# Patient Record
Sex: Female | Born: 1960 | Race: Black or African American | Hispanic: No | Marital: Married | Smoking: Never smoker
Health system: Southern US, Community
[De-identification: ages and names within clinical notes are randomized; demographics above are authoritative.]

## PROBLEM LIST (undated history)

## (undated) DIAGNOSIS — E079 Disorder of thyroid, unspecified: Secondary | ICD-10-CM

## (undated) DIAGNOSIS — E039 Hypothyroidism, unspecified: Secondary | ICD-10-CM

## (undated) DIAGNOSIS — E7800 Pure hypercholesterolemia, unspecified: Secondary | ICD-10-CM

## (undated) DIAGNOSIS — E119 Type 2 diabetes mellitus without complications: Secondary | ICD-10-CM

## (undated) HISTORY — DX: Disorder of thyroid, unspecified: E07.9

## (undated) HISTORY — DX: Pure hypercholesterolemia, unspecified: E78.00

## (undated) HISTORY — PX: FOOT SURGERY: SHX648

## (undated) HISTORY — DX: Type 2 diabetes mellitus without complications: E11.9

---

## 2004-01-11 ENCOUNTER — Ambulatory Visit: Payer: Self-pay | Admitting: Ophthalmology

## 2009-04-03 ENCOUNTER — Ambulatory Visit: Payer: Self-pay | Admitting: Internal Medicine

## 2012-11-18 ENCOUNTER — Ambulatory Visit: Payer: Self-pay | Admitting: Podiatry

## 2013-04-26 ENCOUNTER — Encounter: Payer: Self-pay | Admitting: Podiatry

## 2013-04-26 ENCOUNTER — Ambulatory Visit (INDEPENDENT_AMBULATORY_CARE_PROVIDER_SITE_OTHER): Payer: BC Managed Care – PPO | Admitting: Podiatry

## 2013-04-26 ENCOUNTER — Ambulatory Visit (INDEPENDENT_AMBULATORY_CARE_PROVIDER_SITE_OTHER): Payer: BC Managed Care – PPO

## 2013-04-26 VITALS — Resp 16 | Ht 64.0 in | Wt 238.0 lb

## 2013-04-26 DIAGNOSIS — B351 Tinea unguium: Secondary | ICD-10-CM

## 2013-04-26 DIAGNOSIS — M79609 Pain in unspecified limb: Secondary | ICD-10-CM

## 2013-04-26 DIAGNOSIS — E1149 Type 2 diabetes mellitus with other diabetic neurological complication: Secondary | ICD-10-CM

## 2013-04-26 DIAGNOSIS — M79673 Pain in unspecified foot: Secondary | ICD-10-CM

## 2013-04-26 DIAGNOSIS — Q828 Other specified congenital malformations of skin: Secondary | ICD-10-CM

## 2013-04-26 NOTE — Progress Notes (Signed)
   Subjective:    Patient ID: Sara Buchanan, female    DOB: 06-04-1960, 53 y.o.   MRN: [id number]  HPI Comments: I HAVE A CALLUS ON BOTH FEET, MY TOENAILS ARE THICK WITH FUNGUS AND MY FEET PEEL. IVE BEEN HAVING THESE PROBLEMS OFF AND ON THIS YEAR. THEY GO AWAY AND COME BACK. I GET WIDER SHOES, SOAK MY FEET JOHNSON FOOT SOAK AND EPSON SALT AND TRIM THE DEAD SKIN.  Foot Pain      Review of Systems  Constitutional: Negative.   HENT: Negative.   Eyes: Positive for itching.  Respiratory: Negative.   Cardiovascular: Positive for leg swelling.  Gastrointestinal: Negative.   Genitourinary: Negative.   Musculoskeletal: Negative.   Skin:       CHANGE IN NAILS  Allergic/Immunologic: Negative.   Neurological: Negative.   Hematological: Negative.   Psychiatric/Behavioral: Negative.        Objective:   Physical Exam: I have reviewed her past history medications allergies surgeries social history and review of systems. Pulses are palpable bilateral. Neurologic sensorium is decreased per since once the monofilament to the level of the metatarsophalangeal joints. Deep tendon reflexes are brisk and equal bilateral. Muscle strength is 5 over 5 dorsiflexors plantar flexors inverters everters all intrinsic musculature is intact. Orthopedic evaluation demonstrates pes planus bilateral mild flexible hammertoe deformities. Mild HAV deformities. Cutaneous evaluation demonstrates supple well hydrated cutis nails are thick yellow dystrophic with mycotic painful palpation. Reactive hyperkeratotic tissue particularly porokeratosis to the plantar lateral aspect of the fifth metatarsal heads bilaterally left greater than right. Radiographic evaluation demonstrates osteoarthritic changes of the foot bilateral.        Assessment & Plan:  Assessment: Diabetic peripheral neuropathy. Pain in limb secondary to onychomycosis 1 through 5 bilateral. Porokeratosis bilateral.  Plan: Discussed etiology pathology  conservative versus surgical therapies. Discussed appropriate shoe gear stretching exercises and ice therapy. Debridement nails 1 through 5 bilateral is cover service debridement reactive hyperkeratosis bilateral is cover service secondary to diabetes and neuropathy. I will followup with her in 2 months for reevaluation

## 2013-08-02 ENCOUNTER — Encounter: Payer: Self-pay | Admitting: Podiatry

## 2013-08-02 ENCOUNTER — Ambulatory Visit (INDEPENDENT_AMBULATORY_CARE_PROVIDER_SITE_OTHER): Payer: BC Managed Care – PPO | Admitting: Podiatry

## 2013-08-02 DIAGNOSIS — M79609 Pain in unspecified limb: Secondary | ICD-10-CM

## 2013-08-02 DIAGNOSIS — Q828 Other specified congenital malformations of skin: Secondary | ICD-10-CM

## 2013-08-02 DIAGNOSIS — E1149 Type 2 diabetes mellitus with other diabetic neurological complication: Secondary | ICD-10-CM

## 2013-08-02 DIAGNOSIS — B351 Tinea unguium: Secondary | ICD-10-CM

## 2013-08-02 DIAGNOSIS — M79676 Pain in unspecified toe(s): Secondary | ICD-10-CM

## 2013-08-02 NOTE — Progress Notes (Signed)
She presents today for chief complaint of painful elongated toenails.  Objective: Pulses are palpable bilateral. Nails are thick yellow dystrophic onychomycotic and painful palpation.  Assessment: Pain in limb secondary to onychomycosis 1 through 5 bilateral.  Plan: Debridement of nails 1 through 5 bilateral covered service secondary to pain. 

## 2013-11-01 ENCOUNTER — Ambulatory Visit: Payer: BC Managed Care – PPO | Admitting: Podiatry

## 2013-11-09 ENCOUNTER — Ambulatory Visit (INDEPENDENT_AMBULATORY_CARE_PROVIDER_SITE_OTHER): Payer: BC Managed Care – PPO

## 2013-11-09 ENCOUNTER — Encounter: Payer: Self-pay | Admitting: Podiatry

## 2013-11-09 ENCOUNTER — Ambulatory Visit (INDEPENDENT_AMBULATORY_CARE_PROVIDER_SITE_OTHER): Payer: BC Managed Care – PPO | Admitting: Podiatry

## 2013-11-09 VITALS — BP 121/71 | HR 72 | Resp 16

## 2013-11-09 DIAGNOSIS — M722 Plantar fascial fibromatosis: Secondary | ICD-10-CM

## 2013-11-09 DIAGNOSIS — M79672 Pain in left foot: Secondary | ICD-10-CM

## 2013-11-09 NOTE — Patient Instructions (Signed)
Plantar Fasciitis (Heel Spur Syndrome) with Rehab The plantar fascia is a fibrous, ligament-like, soft-tissue structure that spans the bottom of the foot. Plantar fasciitis is a condition that causes pain in the foot due to inflammation of the tissue. SYMPTOMS   Pain and tenderness on the underneath side of the foot.  Pain that worsens with standing or walking. CAUSES  Plantar fasciitis is caused by irritation and injury to the plantar fascia on the underneath side of the foot. Common mechanisms of injury include:  Direct trauma to bottom of the foot.  Damage to a small nerve that runs under the foot where the main fascia attaches to the heel bone.  Stress placed on the plantar fascia due to bone spurs. RISK INCREASES WITH:   Activities that place stress on the plantar fascia (running, jumping, pivoting, or cutting).  Poor strength and flexibility.  Improperly fitted shoes.  Tight calf muscles.  Flat feet.  Failure to warm-up properly before activity.  Obesity. PREVENTION  Warm up and stretch properly before activity.  Allow for adequate recovery between workouts.  Maintain physical fitness:  Strength, flexibility, and endurance.  Cardiovascular fitness.  Maintain a health body weight.  Avoid stress on the plantar fascia.  Wear properly fitted shoes, including arch supports for individuals who have flat feet. PROGNOSIS  If treated properly, then the symptoms of plantar fasciitis usually resolve without surgery. However, occasionally surgery is necessary. RELATED COMPLICATIONS   Recurrent symptoms that may result in a chronic condition.  Problems of the lower back that are caused by compensating for the injury, such as limping.  Pain or weakness of the foot during push-off following surgery.  Chronic inflammation, scarring, and partial or complete fascia tear, occurring more often from repeated injections. TREATMENT  Treatment initially involves the use of  ice and medication to help reduce pain and inflammation. The use of strengthening and stretching exercises may help reduce pain with activity, especially stretches of the Achilles tendon. These exercises may be performed at home or with a therapist. Your caregiver may recommend that you use heel cups of arch supports to help reduce stress on the plantar fascia. Occasionally, corticosteroid injections are given to reduce inflammation. If symptoms persist for greater than 6 months despite non-surgical (conservative), then surgery may be recommended.  MEDICATION   If pain medication is necessary, then nonsteroidal anti-inflammatory medications, such as aspirin and ibuprofen, or other minor pain relievers, such as acetaminophen, are often recommended.  Do not take pain medication within 7 days before surgery.  Prescription pain relievers may be given if deemed necessary by your caregiver. Use only as directed and only as much as you need.  Corticosteroid injections may be given by your caregiver. These injections should be reserved for the most serious cases, because they may only be given a certain number of times. HEAT AND COLD  Cold treatment (icing) relieves pain and reduces inflammation. Cold treatment should be applied for 10 to 15 minutes every 2 to 3 hours for inflammation and pain and immediately after any activity that aggravates your symptoms. Use ice packs or massage the area with a piece of ice (ice massage).  Heat treatment may be used prior to performing the stretching and strengthening activities prescribed by your caregiver, physical therapist, or athletic trainer. Use a heat pack or soak the injury in warm water. SEEK IMMEDIATE MEDICAL CARE IF:  Treatment seems to offer no benefit, or the condition worsens.  Any medications produce adverse side effects. EXERCISES RANGE   OF MOTION (ROM) AND STRETCHING EXERCISES - Plantar Fasciitis (Heel Spur Syndrome) These exercises may help you  when beginning to rehabilitate your injury. Your symptoms may resolve with or without further involvement from your physician, physical therapist or athletic trainer. While completing these exercises, remember:   Restoring tissue flexibility helps normal motion to return to the joints. This allows healthier, less painful movement and activity.  An effective stretch should be held for at least 30 seconds.  A stretch should never be painful. You should only feel a gentle lengthening or release in the stretched tissue. RANGE OF MOTION - Toe Extension, Flexion  Sit with your right / left leg crossed over your opposite knee.  Grasp your toes and gently pull them back toward the top of your foot. You should feel a stretch on the bottom of your toes and/or foot.  Hold this stretch for __________ seconds.  Now, gently pull your toes toward the bottom of your foot. You should feel a stretch on the top of your toes and or foot.  Hold this stretch for __________ seconds. Repeat __________ times. Complete this stretch __________ times per day.  RANGE OF MOTION - Ankle Dorsiflexion, Active Assisted  Remove shoes and sit on a chair that is preferably not on a carpeted surface.  Place right / left foot under knee. Extend your opposite leg for support.  Keeping your heel down, slide your right / left foot back toward the chair until you feel a stretch at your ankle or calf. If you do not feel a stretch, slide your bottom forward to the edge of the chair, while still keeping your heel down.  Hold this stretch for __________ seconds. Repeat __________ times. Complete this stretch __________ times per day.  STRETCH - Gastroc, Standing  Place hands on wall.  Extend right / left leg, keeping the front knee somewhat bent.  Slightly point your toes inward on your back foot.  Keeping your right / left heel on the floor and your knee straight, shift your weight toward the wall, not allowing your back to  arch.  You should feel a gentle stretch in the right / left calf. Hold this position for __________ seconds. Repeat __________ times. Complete this stretch __________ times per day. STRETCH - Soleus, Standing  Place hands on wall.  Extend right / left leg, keeping the other knee somewhat bent.  Slightly point your toes inward on your back foot.  Keep your right / left heel on the floor, bend your back knee, and slightly shift your weight over the back leg so that you feel a gentle stretch deep in your back calf.  Hold this position for __________ seconds. Repeat __________ times. Complete this stretch __________ times per day. STRETCH - Gastrocsoleus, Standing  Note: This exercise can place a lot of stress on your foot and ankle. Please complete this exercise only if specifically instructed by your caregiver.   Place the ball of your right / left foot on a step, keeping your other foot firmly on the same step.  Hold on to the wall or a rail for balance.  Slowly lift your other foot, allowing your body weight to press your heel down over the edge of the step.  You should feel a stretch in your right / left calf.  Hold this position for __________ seconds.  Repeat this exercise with a slight bend in your right / left knee. Repeat __________ times. Complete this stretch __________ times per day.    STRENGTHENING EXERCISES - Plantar Fasciitis (Heel Spur Syndrome)  These exercises may help you when beginning to rehabilitate your injury. They may resolve your symptoms with or without further involvement from your physician, physical therapist or athletic trainer. While completing these exercises, remember:   Muscles can gain both the endurance and the strength needed for everyday activities through controlled exercises.  Complete these exercises as instructed by your physician, physical therapist or athletic trainer. Progress the resistance and repetitions only as guided. STRENGTH -  Towel Curls  Sit in a chair positioned on a non-carpeted surface.  Place your foot on a towel, keeping your heel on the floor.  Pull the towel toward your heel by only curling your toes. Keep your heel on the floor.  If instructed by your physician, physical therapist or athletic trainer, add ____________________ at the end of the towel. Repeat __________ times. Complete this exercise __________ times per day. STRENGTH - Ankle Inversion  Secure one end of a rubber exercise band/tubing to a fixed object (table, pole). Loop the other end around your foot just before your toes.  Place your fists between your knees. This will focus your strengthening at your ankle.  Slowly, pull your big toe up and in, making sure the band/tubing is positioned to resist the entire motion.  Hold this position for __________ seconds.  Have your muscles resist the band/tubing as it slowly pulls your foot back to the starting position. Repeat __________ times. Complete this exercises __________ times per day.  Document Released: 12/24/2004 Document Revised: 03/18/2011 Document Reviewed: 04/07/2008 [employer] Patient Information 2015 [employer]. This information is not intended to replace advice given to you by your health care provider. Make sure you discuss any questions you have with your health care provider.  

## 2013-11-14 NOTE — Progress Notes (Signed)
Patient ID: Sara Buchanan, female   DOB: 1960/09/17, 53 y.o.   MRN: [id number]  Subjective: Sara Buchanan, 53 year old female, complaints of left heel pain. She states she has pain in the bottom aspect of the heel as well as along the outside aspect of the bottom of the heel. She states this is ongoing for several months but is recently been increasing. She states she has pressure particularly with ambulation and pressure. She denies any specific injury or trauma to the area. No prior treatment. Denies any systemic complaints such as fevers, chills, nausea, vomiting. No acute changes since last appointment. No other complaints at this time.  Objective: AAO x3, NAD DP/PT pulses palpable bilaterally, CRT less than 3 seconds Protective sensation intact with Simms Weinstein monofilament, vibratory sensation intact, Achilles tendon reflex intact Tenderness palpation of the plantar aspect left heel insertion of the plantar fascia and along the plantar lateral aspect of the calcaneus. There is no pain with lateral compression of the calcaneus or with vibratory sensation. No pain along the posterior aspect or along the course of the Achilles tendon. No pain along the course of the plantar fascia within the arch of the foot and the plantar fascia appears to be intact. MMT 5/5, ROM WNL No overlying edema, erythema, increased warmth. No calf pain with compression, swelling, warmth, erythema No open lesions.  Assessment: 53 year old female with left heel pain, likely plantar fasciitis.  Plan: -X-rays were obtained and reviewed with the patient. Plantar calcaneal heel spurring identified as well as posterior retrocalcaneal exostosis and midfoot arthritic changes. -Conservative versus surgical treatment discussed including alternatives, risks, complications. Discussed the etiology. -Discussed stretching exercises. -Ice to the affected area. -Discussed steroid injection. -Dispensed plantar fascial  brace. -Discussed shoe gear modification as well as inserts. -Follow-up in 2 weeks or sooner if any problems are to arise or any change in symptoms. In the meantime call the office with any questions, concerns.

## 2013-11-23 ENCOUNTER — Ambulatory Visit (INDEPENDENT_AMBULATORY_CARE_PROVIDER_SITE_OTHER): Payer: BC Managed Care – PPO | Admitting: Podiatry

## 2013-11-23 VITALS — BP 119/77 | HR 90 | Resp 16

## 2013-11-23 DIAGNOSIS — M722 Plantar fascial fibromatosis: Secondary | ICD-10-CM

## 2013-11-23 NOTE — Patient Instructions (Signed)
Plantar Fasciitis (Heel Spur Syndrome) with Rehab The plantar fascia is a fibrous, ligament-like, soft-tissue structure that spans the bottom of the foot. Plantar fasciitis is a condition that causes pain in the foot due to inflammation of the tissue. SYMPTOMS   Pain and tenderness on the underneath side of the foot.  Pain that worsens with standing or walking. CAUSES  Plantar fasciitis is caused by irritation and injury to the plantar fascia on the underneath side of the foot. Common mechanisms of injury include:  Direct trauma to bottom of the foot.  Damage to a small nerve that runs under the foot where the main fascia attaches to the heel bone.  Stress placed on the plantar fascia due to bone spurs. RISK INCREASES WITH:   Activities that place stress on the plantar fascia (running, jumping, pivoting, or cutting).  Poor strength and flexibility.  Improperly fitted shoes.  Tight calf muscles.  Flat feet.  Failure to warm-up properly before activity.  Obesity. PREVENTION  Warm up and stretch properly before activity.  Allow for adequate recovery between workouts.  Maintain physical fitness:  Strength, flexibility, and endurance.  Cardiovascular fitness.  Maintain a health body weight.  Avoid stress on the plantar fascia.  Wear properly fitted shoes, including arch supports for individuals who have flat feet. PROGNOSIS  If treated properly, then the symptoms of plantar fasciitis usually resolve without surgery. However, occasionally surgery is necessary. RELATED COMPLICATIONS   Recurrent symptoms that may result in a chronic condition.  Problems of the lower back that are caused by compensating for the injury, such as limping.  Pain or weakness of the foot during push-off following surgery.  Chronic inflammation, scarring, and partial or complete fascia tear, occurring more often from repeated injections. TREATMENT  Treatment initially involves the use of  ice and medication to help reduce pain and inflammation. The use of strengthening and stretching exercises may help reduce pain with activity, especially stretches of the Achilles tendon. These exercises may be performed at home or with a therapist. Your caregiver may recommend that you use heel cups of arch supports to help reduce stress on the plantar fascia. Occasionally, corticosteroid injections are given to reduce inflammation. If symptoms persist for greater than 6 months despite non-surgical (conservative), then surgery may be recommended.  MEDICATION   If pain medication is necessary, then nonsteroidal anti-inflammatory medications, such as aspirin and ibuprofen, or other minor pain relievers, such as acetaminophen, are often recommended.  Do not take pain medication within 7 days before surgery.  Prescription pain relievers may be given if deemed necessary by your caregiver. Use only as directed and only as much as you need.  Corticosteroid injections may be given by your caregiver. These injections should be reserved for the most serious cases, because they may only be given a certain number of times. HEAT AND COLD  Cold treatment (icing) relieves pain and reduces inflammation. Cold treatment should be applied for 10 to 15 minutes every 2 to 3 hours for inflammation and pain and immediately after any activity that aggravates your symptoms. Use ice packs or massage the area with a piece of ice (ice massage).  Heat treatment may be used prior to performing the stretching and strengthening activities prescribed by your caregiver, physical therapist, or athletic trainer. Use a heat pack or soak the injury in warm water. SEEK IMMEDIATE MEDICAL CARE IF:  Treatment seems to offer no benefit, or the condition worsens.  Any medications produce adverse side effects. EXERCISES RANGE   OF MOTION (ROM) AND STRETCHING EXERCISES - Plantar Fasciitis (Heel Spur Syndrome) These exercises may help you  when beginning to rehabilitate your injury. Your symptoms may resolve with or without further involvement from your physician, physical therapist or athletic trainer. While completing these exercises, remember:   Restoring tissue flexibility helps normal motion to return to the joints. This allows healthier, less painful movement and activity.  An effective stretch should be held for at least 30 seconds.  A stretch should never be painful. You should only feel a gentle lengthening or release in the stretched tissue. RANGE OF MOTION - Toe Extension, Flexion  Sit with your right / left leg crossed over your opposite knee.  Grasp your toes and gently pull them back toward the top of your foot. You should feel a stretch on the bottom of your toes and/or foot.  Hold this stretch for __________ seconds.  Now, gently pull your toes toward the bottom of your foot. You should feel a stretch on the top of your toes and or foot.  Hold this stretch for __________ seconds. Repeat __________ times. Complete this stretch __________ times per day.  RANGE OF MOTION - Ankle Dorsiflexion, Active Assisted  Remove shoes and sit on a chair that is preferably not on a carpeted surface.  Place right / left foot under knee. Extend your opposite leg for support.  Keeping your heel down, slide your right / left foot back toward the chair until you feel a stretch at your ankle or calf. If you do not feel a stretch, slide your bottom forward to the edge of the chair, while still keeping your heel down.  Hold this stretch for __________ seconds. Repeat __________ times. Complete this stretch __________ times per day.  STRETCH - Gastroc, Standing  Place hands on wall.  Extend right / left leg, keeping the front knee somewhat bent.  Slightly point your toes inward on your back foot.  Keeping your right / left heel on the floor and your knee straight, shift your weight toward the wall, not allowing your back to  arch.  You should feel a gentle stretch in the right / left calf. Hold this position for __________ seconds. Repeat __________ times. Complete this stretch __________ times per day. STRETCH - Soleus, Standing  Place hands on wall.  Extend right / left leg, keeping the other knee somewhat bent.  Slightly point your toes inward on your back foot.  Keep your right / left heel on the floor, bend your back knee, and slightly shift your weight over the back leg so that you feel a gentle stretch deep in your back calf.  Hold this position for __________ seconds. Repeat __________ times. Complete this stretch __________ times per day. STRETCH - Gastrocsoleus, Standing  Note: This exercise can place a lot of stress on your foot and ankle. Please complete this exercise only if specifically instructed by your caregiver.   Place the ball of your right / left foot on a step, keeping your other foot firmly on the same step.  Hold on to the wall or a rail for balance.  Slowly lift your other foot, allowing your body weight to press your heel down over the edge of the step.  You should feel a stretch in your right / left calf.  Hold this position for __________ seconds.  Repeat this exercise with a slight bend in your right / left knee. Repeat __________ times. Complete this stretch __________ times per day.    STRENGTHENING EXERCISES - Plantar Fasciitis (Heel Spur Syndrome)  These exercises may help you when beginning to rehabilitate your injury. They may resolve your symptoms with or without further involvement from your physician, physical therapist or athletic trainer. While completing these exercises, remember:   Muscles can gain both the endurance and the strength needed for everyday activities through controlled exercises.  Complete these exercises as instructed by your physician, physical therapist or athletic trainer. Progress the resistance and repetitions only as guided. STRENGTH -  Towel Curls  Sit in a chair positioned on a non-carpeted surface.  Place your foot on a towel, keeping your heel on the floor.  Pull the towel toward your heel by only curling your toes. Keep your heel on the floor.  If instructed by your physician, physical therapist or athletic trainer, add ____________________ at the end of the towel. Repeat __________ times. Complete this exercise __________ times per day. STRENGTH - Ankle Inversion  Secure one end of a rubber exercise band/tubing to a fixed object (table, pole). Loop the other end around your foot just before your toes.  Place your fists between your knees. This will focus your strengthening at your ankle.  Slowly, pull your big toe up and in, making sure the band/tubing is positioned to resist the entire motion.  Hold this position for __________ seconds.  Have your muscles resist the band/tubing as it slowly pulls your foot back to the starting position. Repeat __________ times. Complete this exercises __________ times per day.  Document Released: 12/24/2004 Document Revised: 03/18/2011 Document Reviewed: 04/07/2008 [employer] Patient Information 2015 [employer]. This information is not intended to replace advice given to you by your health care provider. Make sure you discuss any questions you have with your health care provider.  

## 2013-11-24 NOTE — Progress Notes (Signed)
Patient ID: Sara Buchanan, female   DOB: [birth year], 53 y.o.   MRN: [id number]  Subjective: 53 year old female returns the office they for follow-up evaluation of left heel pain, plantar fasciitis. She states that since last she has purchased over-the-counter inserts. She is also been wearing the plantar fascial brace as well as stretching and icing. She does that she has some improvement in symptoms however she still has some discomfort particularly in the morning or after periods of rest. Denies any recent injury or trauma to the area. No other complaints at this time. Denies any systemic complaints such as fevers, chills, nausea, vomiting. No acute changes since last appointment  Objective: AAO x3, NAD DP/PT pulses palpable bilaterally, CRT less than 3 seconds Protective sensation intact with Simms Weinstein monofilament, vibratory sensation intact, Achilles tendon reflex intact Tenderness palpation of the plantar medial aspect of the left heel at the insertion of the plantar fascia. There is no pain with lateral compression of the calcaneus or pain with vibratory sensation. No pain within the arch of the foot along the course that plantar fascia. No pain on the posterior aspect of the calcaneus or along the course of the insertion of the Achilles tendon. No overlying edema, erythema, increase in warmth. MMT 5/5, ROM WNL No calf pain with compression, swelling, warmth, erythema No open lesions.  Assessment: 53 year old female with left heel pain, plantar fasciitis  Plan: -Treatment options were discussed including alternatives, risks, competitions. -Patient wishes to hold off on any steroid injection. -Continue ice the area -Continue stretching exercises. -Continue with inserts. She states that she'll be purchasing better shoes soon. -Dispensed night splint. -Follow-up in 3 weeks. In the meantime, call the office with any questions, concerns, changes symptoms.

## 2013-12-23 ENCOUNTER — Ambulatory Visit: Payer: BC Managed Care – PPO | Admitting: Podiatry

## 2014-01-11 ENCOUNTER — Ambulatory Visit: Payer: BC Managed Care – PPO | Admitting: Podiatry

## 2014-01-20 ENCOUNTER — Ambulatory Visit (INDEPENDENT_AMBULATORY_CARE_PROVIDER_SITE_OTHER): Payer: BC Managed Care – PPO

## 2014-01-20 ENCOUNTER — Ambulatory Visit (INDEPENDENT_AMBULATORY_CARE_PROVIDER_SITE_OTHER): Payer: BC Managed Care – PPO | Admitting: Podiatry

## 2014-01-20 VITALS — BP 127/76 | HR 99 | Resp 16

## 2014-01-20 DIAGNOSIS — M722 Plantar fascial fibromatosis: Secondary | ICD-10-CM

## 2014-01-20 DIAGNOSIS — M19071 Primary osteoarthritis, right ankle and foot: Secondary | ICD-10-CM

## 2014-01-20 DIAGNOSIS — M79672 Pain in left foot: Secondary | ICD-10-CM

## 2014-01-20 DIAGNOSIS — M779 Enthesopathy, unspecified: Secondary | ICD-10-CM

## 2014-01-20 DIAGNOSIS — L84 Corns and callosities: Secondary | ICD-10-CM

## 2014-01-21 NOTE — Progress Notes (Signed)
Patient ID: Sara Buchanan, female   DOB: Jan 10, 1960, 54 y.o.   MRN: [id number]  Subjective: 54 year old female presents the office they for evaluation of left heel pain. She states that since last appointment the pain and left heel has improved significantly and she is back to, to the gym and exercising without much problems. She's been continuing the stretching and the icing exercises as well as a plantar fascial brace. Denies any recent swelling or redness overlying the area. Today she does have new complaints of a corn on the bottom of her left foot for which she points to the submetatarsal 5 area. She is asking if his area can be debrided as it is painful particularly with shoe gear and pressure. She also has new complaints of pain to the right ankle. She states that since starting to work out she has had pain to this area. In December that she was on a cruise and she states that her ankle gave out on her. However over the last couple weeks his symptoms have improved on her own without much treatment. She is able to go to the gym and exercise without any problems. Denies any injury to this area. Denies any systemic complaints such as fevers, chills, nausea, vomiting. No acute changes since last appointment, and no other complaints at this time.   Objective: AAO x3, NAD DP/PT pulses palpable bilaterally, CRT less than 3 seconds Protective sensation intact with Simms Weinstein monofilament, vibratory sensation intact, Achilles tendon reflex intact On the left lower extremity there is no tenderness to palpation overlying the plantar medial tubercle of the calcaneus at the insertion of the plantar fascia. There is no pain along the course of plantar fascial in the arch of the foot and the ligament appears to be intact. There is no pain with lateral compression of the calcaneus or pain with vibratory sensation. No pain along the posterior aspect of the calcaneus or along the course/insertion of the  Achilles tendon. No other areas of tenderness to the left lower extremity.  On the right lower extremity there is mild discomfort with end range of motion in dorsiflexion to the ankle. There is mild discomfort along the medial gutter of the ankle joint. There is no areas of pinpoint bony tenderness or pain the vibratory sensation overlying the area. There is no discomfort overlying the medial/ankle ligaments or the syndesmosis. No overlying edema, erythema, increase in warmth. No areas of pinpoint bony tenderness or pain with vibratory sensation. MMT 5/5, ROM WNL. No open lesions.  Hyperkeratotic lesion left submetatarsal 5 which is tender to palpation. Upon debridement no underlying ulceration and there is no clinical signs of infection. No pain with calf compression, swelling, warmth, erythema  Assessment:  54 year old female follow-up evaluation left heel pain which is resolving; left submetatarsal 5 hyperkeratotic lesion; right ankle pain likely result of osteoarthritis  Plan: -X-rays were obtained and reviewed of the right ankle -All treatment options discussed with the patient including all alternatives, risks, complications.  -In regards to the left heel pain the symptoms seem to be resolving. Continue with stretching, icing, shoe gear modifications in the plantar fascial brace. Again patient wishes to hold off on any steroid injection. -Left submetatarsal 5 hyperkeratotic lesion sharply debrided without complication/bleeding to patient comfort. -Address the likely etiology of the right ankle pain with the patient. Is likely result of mild degenerative changes in the ankle and on the lateral x-ray there is a exostosis off the dorsal aspect of the talus which  is likely impinging the ankle at and range of motion. She does not feel unsteady and does not feels that she needs a brace as she is currently not having any symptoms. Continue to monitor the area. -Follow-up as needed. Patient encouraged  to call the office with any questions, concerns, change in symptoms.

## 2014-02-28 ENCOUNTER — Ambulatory Visit (INDEPENDENT_AMBULATORY_CARE_PROVIDER_SITE_OTHER): Payer: BC Managed Care – PPO | Admitting: Podiatry

## 2014-02-28 DIAGNOSIS — M779 Enthesopathy, unspecified: Secondary | ICD-10-CM

## 2014-02-28 DIAGNOSIS — M7752 Other enthesopathy of left foot: Secondary | ICD-10-CM

## 2014-02-28 DIAGNOSIS — M778 Other enthesopathies, not elsewhere classified: Secondary | ICD-10-CM

## 2014-02-28 DIAGNOSIS — M19071 Primary osteoarthritis, right ankle and foot: Secondary | ICD-10-CM

## 2014-02-28 DIAGNOSIS — M722 Plantar fascial fibromatosis: Secondary | ICD-10-CM

## 2014-02-28 MED ORDER — MELOXICAM 15 MG PO TABS: 15.0000 mg | ORAL_TABLET | Freq: Every day | ORAL | Status: DC

## 2014-02-28 NOTE — Progress Notes (Signed)
She presents today for follow-up of right ankle pain and left foot pain particularly plantar fasciitis left. She states that nothing really seems to be improving at this point. She denies any worsening in her symptoms. She denies any changes in her past medical history medications allergies her surgery. She denies any trauma.  Objective: Vital signs are stable she is alert and oriented 3. Pulses are strongly palpable bilateral. She has tenderness in the anterior ankle and the superior part of the mortise. She also has pain on palpation medial calcaneal tubercle of the left heel.  Assessment: Capsulitis ankle joint right previously diagnosed as osteoarthritis. Plantar fasciitis with pes planus left.  Plan: Discussed etiology pathology conservative versus surgical therapies started her on meloxicam 15 mg 1 by mouth daily. I also injected her with 20 mg of Kenalog to the ankle joint after Betadine skin prep. Also injected 20 mg of Kenalog to the left heel after Betadine skin prep. I encouraged her to continue her current therapies including the plantar fascial brace and night splint shoe gear modifications etc. She was also scanned for set of orthotics today. I will follow-up with her in 3-4 weeks once those return.

## 2014-03-28 ENCOUNTER — Ambulatory Visit (INDEPENDENT_AMBULATORY_CARE_PROVIDER_SITE_OTHER): Payer: BC Managed Care – PPO | Admitting: Podiatry

## 2014-03-28 VITALS — BP 140/85 | HR 85 | Resp 16

## 2014-03-28 DIAGNOSIS — M19071 Primary osteoarthritis, right ankle and foot: Secondary | ICD-10-CM | POA: Diagnosis not present

## 2014-03-28 DIAGNOSIS — M7752 Other enthesopathy of left foot: Secondary | ICD-10-CM | POA: Diagnosis not present

## 2014-03-28 DIAGNOSIS — M778 Other enthesopathies, not elsewhere classified: Secondary | ICD-10-CM

## 2014-03-28 DIAGNOSIS — M779 Enthesopathy, unspecified: Principal | ICD-10-CM

## 2014-03-28 NOTE — Progress Notes (Signed)
She presents today to pick up her orthotics. She states that her fasciitis is 100% better as is her capsulitis to her right ankle.  Objective: Vital signs are stable she is alert and oriented 3. Pulses are palpable. She has no heel pain and no pain about the ankle right foot.  Assessment: Well-healing capsulitis right ankle.  Plan: Continue use of the orthotics in both oral and then home with instructions for the use of these will follow up with her

## 2014-03-28 NOTE — Patient Instructions (Signed)

## 2014-07-14 ENCOUNTER — Telehealth: Payer: Self-pay | Admitting: *Deleted

## 2014-07-14 NOTE — Telephone Encounter (Signed)
Pt asked what to do for her callouses and numb toes and if she could get in earlier.

## 2014-07-14 NOTE — Telephone Encounter (Signed)
Patient needs an appointment

## 2014-08-08 ENCOUNTER — Ambulatory Visit (INDEPENDENT_AMBULATORY_CARE_PROVIDER_SITE_OTHER): Payer: BC Managed Care – PPO | Admitting: Podiatry

## 2014-08-08 ENCOUNTER — Ambulatory Visit (INDEPENDENT_AMBULATORY_CARE_PROVIDER_SITE_OTHER): Payer: BC Managed Care – PPO

## 2014-08-08 ENCOUNTER — Encounter: Payer: Self-pay | Admitting: Podiatry

## 2014-08-08 VITALS — BP 108/62 | HR 92 | Resp 16

## 2014-08-08 DIAGNOSIS — Q828 Other specified congenital malformations of skin: Secondary | ICD-10-CM

## 2014-08-08 DIAGNOSIS — E1142 Type 2 diabetes mellitus with diabetic polyneuropathy: Secondary | ICD-10-CM

## 2014-08-08 DIAGNOSIS — M779 Enthesopathy, unspecified: Secondary | ICD-10-CM

## 2014-08-08 DIAGNOSIS — M79673 Pain in unspecified foot: Secondary | ICD-10-CM

## 2014-08-08 DIAGNOSIS — B351 Tinea unguium: Secondary | ICD-10-CM

## 2014-08-08 DIAGNOSIS — L603 Nail dystrophy: Secondary | ICD-10-CM | POA: Diagnosis not present

## 2014-08-08 DIAGNOSIS — M79606 Pain in leg, unspecified: Secondary | ICD-10-CM

## 2014-08-08 NOTE — Progress Notes (Signed)
She presents today with a chief complaint of numbness to the fourth and fifth digits of the left foot. She states that her diabetes is still out of control with her hemoglobin A1c of 7.6. She is also complaining of a superficial callus to the plantar aspect of the left foot as well as thick mycotic nails bilaterally. She denies changes in her past medical history medications allergies surgeries or social history.  Objective: Vital signs are stable she is alert and oriented 3 pulses are palpable bilateral. Neurologic sensorium is intact across all toes. Nails are thick yellow dystrophic lytic or mycotic and painful palpation. She also has poor keratoma plantar aspect metatarsal head of the left foot. No signs of infection other than onychomycosis.  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 Pain in limb secondary to the onychomycosis and porokeratosis bilateral.  Plan: Debridement nails 1 through 5 bilateral spelled service secondary to pain debridement of poor keratoma bilateral secondary to pain. Also discussed the need for her to maintain tight control of her diabetes. She understands this and is amenable to it.

## 2014-11-12 ENCOUNTER — Encounter: Payer: Self-pay | Admitting: Emergency Medicine

## 2014-11-12 ENCOUNTER — Ambulatory Visit
Admission: EM | Admit: 2014-11-12 | Discharge: 2014-11-12 | Disposition: A | Discharge status: Home / Self Care | Payer: BC Managed Care – PPO | Attending: Family Medicine | Admitting: Family Medicine

## 2014-11-12 DIAGNOSIS — R05 Cough: Secondary | ICD-10-CM

## 2014-11-12 DIAGNOSIS — J302 Other seasonal allergic rhinitis: Secondary | ICD-10-CM

## 2014-11-12 DIAGNOSIS — R059 Cough, unspecified: Secondary | ICD-10-CM

## 2014-11-12 MED ORDER — HYDROCOD POLST-CPM POLST ER 10-8 MG/5ML PO SUER: 5.0000 mL | Freq: Two times a day (BID) | ORAL | Status: DC | PRN

## 2014-11-12 MED ORDER — IPRATROPIUM-ALBUTEROL 0.5-2.5 (3) MG/3ML IN SOLN
3.0000 mL | Freq: Once | RESPIRATORY_TRACT | Status: AC
  Administered 2014-11-12: 3 mL via RESPIRATORY_TRACT

## 2014-11-12 MED ORDER — BENZONATATE 100 MG PO CAPS: 100.0000 mg | ORAL_CAPSULE | Freq: Three times a day (TID) | ORAL | Status: DC | PRN

## 2014-11-12 MED ORDER — FLUTICASONE PROPIONATE 50 MCG/ACT NA SUSP: 2.0000 | Freq: Every day | NASAL | Status: DC

## 2014-11-12 MED ORDER — ALBUTEROL SULFATE HFA 108 (90 BASE) MCG/ACT IN AERS: 1.0000 | INHALATION_SPRAY | RESPIRATORY_TRACT | Status: DC | PRN

## 2014-11-12 NOTE — Discharge Instructions (Signed)
Claritin ( Loratadine)  1 tablet daily  Other medications as we discussed  Above... Fluids, rest , steamy showers...  Suggest continuing your allergy meds until hard freeze in [place] or all year round if desired  Return to us or to PCP if fever or increased malaise despite our therapies...  Cough, Adult Coughing is a reflex that clears your throat and your airways. Coughing helps to heal and protect your lungs. It is normal to cough occasionally, but a cough that happens with other symptoms or lasts a long time may be a sign of a condition that needs treatment. A cough may last only 2-3 weeks (acute), or it may last longer than 8 weeks (chronic). CAUSES Coughing is commonly caused by:  Breathing in substances that irritate your lungs.  A viral or bacterial respiratory infection.  Allergies.  Asthma.  Postnasal drip.  Smoking.  Acid backing up from the stomach into the esophagus (gastroesophageal reflux).  Certain medicines.  Chronic lung problems, including COPD (or rarely, lung cancer).  Other medical conditions such as heart failure. HOME CARE INSTRUCTIONS  Pay attention to any changes in your symptoms. Take these actions to help with your discomfort:  Take medicines only as told by your health care provider.  If you were prescribed an antibiotic medicine, take it as told by your health care provider. Do not stop taking the antibiotic even if you start to feel better.  Talk with your health care provider before you take a cough suppressant medicine.  Drink enough fluid to keep your urine clear or pale yellow.  If the air is dry, use a cold steam vaporizer or humidifier in your bedroom or your home to help loosen secretions.  Avoid anything that causes you to cough at work or at home.  If your cough is worse at night, try sleeping in a semi-upright position.  Avoid cigarette smoke. If you smoke, quit smoking. If you need help quitting, ask your health care  provider.  Avoid caffeine.  Avoid alcohol.  Rest as needed. SEEK MEDICAL CARE IF:   You have new symptoms.  You cough up pus.  Your cough does not get better after 2-3 weeks, or your cough gets worse.  You cannot control your cough with suppressant medicines and you are losing sleep.  You develop pain that is getting worse or pain that is not controlled with pain medicines.  You have a fever.  You have unexplained weight loss.  You have night sweats. SEEK IMMEDIATE MEDICAL CARE IF:  You cough up blood.  You have difficulty breathing.  Your heartbeat is very fast.   This information is not intended to replace advice given to you by your health care provider. Make sure you discuss any questions you have with your health care provider.   Document Released: 06/22/2010 Document Revised: 09/14/2014 Document Reviewed: 03/02/2014 Elsevier Interactive Patient Education [employer]2016 Elsevier Inc.

## 2014-11-12 NOTE — ED Notes (Signed)
Patient c/o cough and chest congestion for 2 weeks.   

## 2014-11-12 NOTE — ED Provider Notes (Signed)
CSN: [id number]     Arrival date & time 11/12/14  [id number] History   First MD Initiated Contact with Patient 11/12/14 [phone]     Chief Complaint  Patient presents with  . Cough   (Consider location/radiation/quality/duration/timing/severity/associated sxs/prior Treatment) HPI  54 yo F has had cough and congestion for two weeks. Mild fever the first week, but not recently. Clear mucous production in early morning Has significant post nasal drip and night time mucous routinely- has not considered seasonal allergy dx before. Cough is currently interrupting day and difficult to fall asleep. 2 adults in the household, no smoking  Past Medical History  Diagnosis Date  . Diabetes (HCC)   . High cholesterol   . Thyroid condition    Past Surgical History  Procedure Laterality Date  . Foot surgery Bilateral    History reviewed. No pertinent family history. Social History  Substance Use Topics  . Smoking status: Never Smoker   . Smokeless tobacco: None  . Alcohol Use: No   OB History    No data available     Review of Systems  Constitutional: No fever.  Eyes: No visual changes. ENT:No sore throat. Cardiovascular:Negative for chest pain/palpitations Respiratory: Negative for shortness of breath, positive cough Gastrointestinal: No abdominal pain. No nausea,vomiting, diarrhea Genitourinary: Negative for dysuria. Normal urination. Musculoskeletal: Negative for back pain. FROM extremities without pain Skin: Negative for rash Neurological: Negative for headache, focal weakness or numbness  Allergies  Metformin and related  Home Medications   Prior to Admission medications   Medication Sig Start Date End Date Taking? Authorizing Provider  Liraglutide (VICTOZA) 18 MG/3ML SOPN Inject 1.8 mLs into the skin 2 (two) times daily.   Yes Historical Provider, MD  albuterol (PROVENTIL HFA;VENTOLIN HFA) 108 (90 BASE) MCG/ACT inhaler Inhale 1-2 puffs into the lungs every 4 (four) hours as needed  for wheezing or shortness of breath. Or cough 11/12/14   Rae HalstedLaurie W Vinita Prentiss, PA-C  benzonatate (TESSALON) 100 MG capsule Take 1 capsule (100 mg total) by mouth 3 (three) times daily as needed. 11/12/14   Rae HalstedLaurie W Chinenye Katzenberger, PA-C  Canagliflozin [facility](INVOKANA) 100 MG TABS Take by mouth daily.    Historical Provider, MD  carvedilol (COREG) 6.25 MG tablet Take 6.25 mg by mouth daily.    Historical Provider, MD  chlorpheniramine-HYDROcodone (TUSSIONEX PENNKINETIC ER) 10-8 MG/5ML SUER Take 5 mLs by mouth every 12 (twelve) hours as needed for cough. 11/12/14   Rae HalstedLaurie W Jrue Jarriel, PA-C  fluticasone [facility](FLONASE) 50 MCG/ACT nasal spray Place 2 sprays into both nostrils daily. 11/12/14   Rae HalstedLaurie W Satia Winger, PA-C  insulin detemir (LEVEMIR) 100 UNIT/ML injection Inject into the skin at bedtime.    Historical Provider, MD  levothyroxine (SYNTHROID, LEVOTHROID) 75 MCG tablet Take 75 mcg by mouth daily before breakfast.    Historical Provider, MD  lisinopril (PRINIVIL,ZESTRIL) 10 MG tablet Take 10 mg by mouth daily.    Historical Provider, MD  lovastatin (MEVACOR) 20 MG tablet Take 40 mg by mouth at bedtime.     Historical Provider, MD  meloxicam (MOBIC) 15 MG tablet Take 1 tablet (15 mg total) by mouth daily. 02/28/14   Max T Hyatt, DPM  metroNIDAZOLE (FLAGYL) 500 MG tablet  07/18/14   Historical Provider, MD  NOVOFINE 32G X 6 MM MISC  08/01/14   Historical Provider, MD  Vitamin D, Ergocalciferol, (DRISDOL) 50000 UNITS CAPS capsule  08/03/14   Historical Provider, MD   Meds Ordered and Administered this Visit   Medications  ipratropium-albuterol (DUONEB)  0.5-2.5 (3) MG/3ML nebulizer solution 3 mL (3 mLs Nebulization Given 11/12/14 0851)  Patient felt much improved after therapy -coughed to clear  BP 126/71 mmHg  Pulse 64  Temp(Src) 96.9 F (36.1 C) (Tympanic)  Resp 16  Ht  (1.626 m)  Wt 258 lb (117.028 kg)  BMI 44.26 kg/m2  SpO2 100% No data found.   Physical Exam Constitutional: Alert and oriented, well appearing, VS are noted,   General : No acute distress; Head:normocephalic, atraumatic,  Eyes: conjugate gaze,negative conjunctiva,  Ears:Grossly normal hearing, Bilateral canals and tympanic membranes WNL Nose:normal Mouth/throat :Mucous membranes moist, No pharyngeal erythema,no exudate,  Neck :  supple  Heart: Normal rate, regular rhythm Lung:    Normal respiratory effort and rate , right lung clear, left with wheezing full files-- resolved after DuoNeb, clear on repeat Back:    No CVAT, no spinal tenderness noted Abd :    soft, non-tender, bowel sounds present, no guarding,rebound or organomegaly  appreciated MSK:   nontender, normal ROM all extremities; ambulatory in unit, on and off table without assistance Neuro:Face symmetric, EOMI, PERRLA,tongue midline. Grossly intact; good attention and recall,normal gait, normal speech and language Skin:  Warm,dry,intact Psych: mood and affect WNL  ED Course  Procedures (including critical care time)  Labs Review Labs Reviewed - No data to display  Imaging Review No results found.   Visual Acuity Review .   MDM   1. Cough   2. Seasonal allergies    Plan:  results and diagnosis reviewed with patient Rx as per orders;  benefits, risks, potential side effects reviewed   Recommend supportive treatment with OTC and Rx as discussed Seek additional medical care if symptoms worsen or are not improving  New Prescriptions   ALBUTEROL (PROVENTIL HFA;VENTOLIN HFA) 108 (90 BASE) MCG/ACT INHALER    Inhale 1-2 puffs into the lungs every 4 (four) hours as needed for wheezing or shortness of breath. Or cough   BENZONATATE (TESSALON) 100 MG CAPSULE    Take 1 capsule (100 mg total) by mouth 3 (three) times daily as needed.   CHLORPHENIRAMINE-HYDROCODONE (TUSSIONEX PENNKINETIC ER) 10-8 MG/5ML SUER    Take 5 mLs by mouth every 12 (twelve) hours as needed for cough.   FLUTICASONE (FLONASE) 50 MCG/ACT NASAL SPRAY    Place 2 sprays into both nostrils daily.    Rae Halsted, PA-C 11/12/14 [phone]

## 2016-10-02 ENCOUNTER — Ambulatory Visit (INDEPENDENT_AMBULATORY_CARE_PROVIDER_SITE_OTHER): Payer: BC Managed Care – PPO | Admitting: Podiatry

## 2016-10-02 DIAGNOSIS — L600 Ingrowing nail: Secondary | ICD-10-CM | POA: Diagnosis not present

## 2016-10-02 DIAGNOSIS — L03039 Cellulitis of unspecified toe: Secondary | ICD-10-CM | POA: Diagnosis not present

## 2016-10-02 MED ORDER — NEOMYCIN-POLYMYXIN-HC 1 % OT SOLN: OTIC | 1 refills | Status: DC

## 2016-10-02 NOTE — Patient Instructions (Signed)

## 2016-10-02 NOTE — Progress Notes (Signed)
She presents today with chief complaint of painful hallux nails bilaterally. She states that she's tried trimming the nails in the past but being a diabetic she is concerned about cutting herself and the nails getting too thick and rubbing a sore beneath the nail itself. He states that she will like to have them taken off and allow them to regrow. States her blood sugar runs about 130 in the mornings and is high as 200 occasionally if she fish. States her most recent hemoglobin A1c was in the sevens.  Objective: Vital signs are stable she is alert and oriented 3. Pulses are strong and palpable. Neurologic system is intact. Deep tendon reflexes are intact. Muscle strength is 5 over 5 dorsiflexors plantar flexors and inverters everters all into the musculature is intact. Orthopedic evaluation was resolved joints distal to the ankle for range of motion without crepitation. Cutaneous evaluation demonstrates thick painful dystrophic nails hallux bilateral. Mild erythema surrounding the nail hallux nail plate right appears to be loose with fluctuance beneath the nail plate plate.  Assessment: Paronychia ingrown nail hallux bilateral. Diabetes non-complicated.  Plan: Perform total nail avulsions today temporary. She tolerated this procedure well after local anesthesia was administered. She was given both oral and written home-going instructions for care and soaking of her toe as well as a prescription for Cortisporin Otic be applied twice daily after soaking. I will follow up with her in 1-2 weeks at most she will call with questions or concerns.

## 2016-10-02 NOTE — Addendum Note (Signed)
Addended by: Kristian Covey on: 10/02/2016 04:25 PM   Modules accepted: Orders

## 2016-10-16 ENCOUNTER — Ambulatory Visit (INDEPENDENT_AMBULATORY_CARE_PROVIDER_SITE_OTHER): Payer: BC Managed Care – PPO | Admitting: Podiatry

## 2016-10-16 DIAGNOSIS — L600 Ingrowing nail: Secondary | ICD-10-CM

## 2016-10-16 DIAGNOSIS — L03039 Cellulitis of unspecified toe: Secondary | ICD-10-CM

## 2016-10-16 NOTE — Progress Notes (Signed)
She presents today for follow-up of her nail avulsions hallux bilaterally. She's doing very well she says in a few to be drying out very little drainage.  Objective: Vital signs are stable alert and oriented 3 pulses are palpable. No erythema cellulitis drainage or odor.  Assessment: Well-healed surgical toes hallux bilateral.  Plan: Continue to soak daily Epsom salts and warm water coverage in the daily open at bedtime.

## 2017-04-11 ENCOUNTER — Encounter: Payer: Self-pay | Admitting: Certified Nurse Midwife

## 2017-04-21 ENCOUNTER — Encounter: Payer: Self-pay | Admitting: Obstetrics and Gynecology

## 2017-04-21 ENCOUNTER — Ambulatory Visit: Payer: BC Managed Care – PPO | Admitting: Obstetrics and Gynecology

## 2017-04-21 VITALS — BP 120/74 | HR 63 | Ht 64.0 in | Wt 262.3 lb

## 2017-04-21 DIAGNOSIS — N952 Postmenopausal atrophic vaginitis: Secondary | ICD-10-CM | POA: Diagnosis not present

## 2017-04-21 NOTE — Progress Notes (Signed)
GYNECOLOGY CLINIC PROGRESS NOTE  Subjective:     Sara Buchanan is a 57 y.o. P0001 (adopted) postmenopausal (x1 year) female who presents for evaluation of an abnormal vaginal discharge. Symptoms have been present for 1 month. Vaginal symptoms: discharge described as grey (with no odor, occasionally occurs with wiping), local irritation and vulvar itching. Itching initially began both internally and externally, however is now mostly external. Contraception: post menopausal status. She denies abnormal bleeding, blisters, bumps, burning and dyspareunia.  Sexually transmitted infection risk: very low risk of STD exposure.  Last pap smear was ~ 2 years ago, per patient.   In addition, patient has questions regarding sex and menopause. Notes that she and her husband are not intimate much, but thinks it's more related to him as his libido seems to be gone.  Wonders what affects limited sexual activity will have on her in the future. Patient denies dyspareunia.   Patient also desires to discuss weight loss management.  Notes she is a Type II diabetic, and would like to work on weight loss so that she can decrease her medication needs.    Past Medical History:  Diagnosis Date  . Diabetes (HCC)   . High cholesterol   . Thyroid condition     Family History  Problem Relation Age of Onset  . Diabetes Mother   . Hypertension Mother   . Hypertension Father   . Cancer Father     Past Surgical History:  Procedure Laterality Date  . FOOT SURGERY Bilateral     Social History   Socioeconomic History  . Marital status: Married    Spouse name: Not on file  . Number of children: Not on file  . Years of education: Not on file  . Highest education level: Not on file  Occupational History  . Not on file  Social Needs  . Financial resource strain: Not on file  . Food insecurity:    Worry: Not on file    Inability: Not on file  . Transportation needs:    Medical: Not on file   Non-medical: Not on file  Tobacco Use  . Smoking status: Never Smoker  . Smokeless tobacco: Never Used  Substance and Sexual Activity  . Alcohol use: No  . Drug use: Never  . Sexual activity: Not Currently    Birth control/protection: None, Post-menopausal  Lifestyle  . Physical activity:    Days per week: Not on file    Minutes per session: Not on file  . Stress: Not on file  Relationships  . Social connections:    Talks on phone: Not on file    Gets together: Not on file    Attends religious service: Not on file    Active member of club or organization: Not on file    Attends meetings of clubs or organizations: Not on file    Relationship status: Not on file  . Intimate partner violence:    Fear of current or ex partner: Not on file    Emotionally abused: Not on file    Physically abused: Not on file    Forced sexual activity: Not on file  Other Topics Concern  . Not on file  Social History Narrative  . Not on file     Current Outpatient Medications on File Prior to Visit  Medication Sig Dispense Refill  . albuterol (PROVENTIL HFA;VENTOLIN HFA) 108 (90 BASE) MCG/ACT inhaler Inhale 1-2 puffs into the lungs every 4 (four) hours as needed for  wheezing or shortness of breath. Or cough 1 Inhaler 0  . carvedilol (COREG) 6.25 MG tablet Take 6.25 mg by mouth daily.    Marland Kitchen. FLUARIX QUADRIVALENT 0.5 ML injection     . insulin detemir (LEVEMIR) 100 UNIT/ML injection Inject into the skin at bedtime.    Marland Kitchen. LEVEMIR FLEXTOUCH 100 UNIT/ML Pen     . levothyroxine (SYNTHROID, LEVOTHROID) 75 MCG tablet Take 75 mcg by mouth daily before breakfast.    . Liraglutide (VICTOZA) 18 MG/3ML SOPN Inject 1.8 mLs into the skin 2 (two) times daily.    Marland Kitchen. lisinopril (PRINIVIL,ZESTRIL) 10 MG tablet Take 10 mg by mouth daily.    Marland Kitchen. lovastatin (MEVACOR) 20 MG tablet Take 40 mg by mouth at bedtime.     . meloxicam (MOBIC) 15 MG tablet Take 1 tablet (15 mg total) by mouth daily. 30 tablet 3  . metFORMIN  (GLUCOPHAGE) 500 MG tablet Take by mouth 2 (two) times daily with a meal.    . Multiple Vitamin (MULTIVITAMIN) tablet Take 1 tablet by mouth daily.    Marland Kitchen. NOVOFINE 32G X 6 MM MISC     . Vitamin D, Ergocalciferol, (DRISDOL) 50000 UNITS CAPS capsule      No current facility-administered medications on file prior to visit.     No Active Allergies   Review of Systems Pertinent items noted in HPI and remainder of comprehensive ROS otherwise negative.    Objective:    BP 120/74   Pulse 63   Ht 5\' 4"  (1.626 m)   Wt 262 lb 4.8 oz (119 kg)   BMI 45.02 kg/m  General appearance: alert, no distress and morbidly obese Abdomen: soft, non-tender; bowel sounds normal; no masses,  no organomegaly Pelvic: external genitalia with moderate pallor along labia majora bilaterally from base up to clitoral hood. Rectovaginal septum normal.  Vagina with very scant thin white discharge, no odor.  Cervix normal appearing, mildly stenotic,  no lesions and no motion tenderness.  Uterus mobile, nontender, normal shape and size.  Adnexae non-palpable, nontender bilaterally.  Extremities: extremities normal, atraumatic, no cyanosis or edema Neurologic: Grossly normal     Microscopic wet-mount exam shows negative for pathogens, normal epithelial cells.  Assessment:    Atrophic vaginitis.   Morbid obesity  Plan:    Discussed management options for suspected atrophic vaginitis, including vaginal moisturizers, local estrogen or testosterone therapy.  Patient ok to begin local estrogen therapy with Premarin cream. To apply internally once weekly, and apply externally 2-3 times weekly.    To f/u in 4 weeks to reassess symptoms.  Patient desires to discuss assistance with weight loss management.  Will refer to Harlow MaresMelody Shambley, CNM for further discussion and possible management.    Hildred Laserherry, Koston Hennes, MD [facility]

## 2017-04-21 NOTE — Progress Notes (Signed)
Pt has had itching and burning for about a month inside and outside the vaginal area. Currently it is more outside than inside. No discharge.

## 2017-05-21 ENCOUNTER — Encounter: Payer: BC Managed Care – PPO | Admitting: Obstetrics and Gynecology

## 2017-05-29 ENCOUNTER — Encounter: Payer: BC Managed Care – PPO | Admitting: Obstetrics and Gynecology

## 2017-06-05 ENCOUNTER — Encounter: Payer: Self-pay | Admitting: Obstetrics and Gynecology

## 2017-06-11 ENCOUNTER — Encounter: Payer: Self-pay | Admitting: Obstetrics and Gynecology

## 2018-09-02 ENCOUNTER — Other Ambulatory Visit: Payer: Self-pay

## 2018-09-02 DIAGNOSIS — Z20822 Contact with and (suspected) exposure to covid-19: Secondary | ICD-10-CM

## 2018-09-03 ENCOUNTER — Telehealth: Payer: Self-pay | Admitting: Internal Medicine

## 2018-09-03 LAB — NOVEL CORONAVIRUS, NAA: SARS-CoV-2, NAA: NOT DETECTED

## 2018-09-03 LAB — SPECIMEN STATUS REPORT

## 2018-09-03 NOTE — Telephone Encounter (Signed)
Negative COVID results given. Patient results "NOT Detected." Caller expressed understanding. ° °

## 2019-01-09 ENCOUNTER — Other Ambulatory Visit: Payer: Self-pay

## 2019-01-09 ENCOUNTER — Emergency Department
Admission: EM | Admit: 2019-01-09 | Discharge: 2019-01-10 | Disposition: A | Discharge status: Home / Self Care | Payer: BC Managed Care – PPO | Attending: Emergency Medicine | Admitting: Emergency Medicine

## 2019-01-09 ENCOUNTER — Encounter: Payer: Self-pay | Admitting: Emergency Medicine

## 2019-01-09 ENCOUNTER — Emergency Department: Payer: BC Managed Care – PPO

## 2019-01-09 DIAGNOSIS — R42 Dizziness and giddiness: Secondary | ICD-10-CM | POA: Insufficient documentation

## 2019-01-09 DIAGNOSIS — Z79899 Other long term (current) drug therapy: Secondary | ICD-10-CM | POA: Insufficient documentation

## 2019-01-09 DIAGNOSIS — Z794 Long term (current) use of insulin: Secondary | ICD-10-CM | POA: Diagnosis not present

## 2019-01-09 DIAGNOSIS — R079 Chest pain, unspecified: Secondary | ICD-10-CM | POA: Insufficient documentation

## 2019-01-09 DIAGNOSIS — R0602 Shortness of breath: Secondary | ICD-10-CM | POA: Insufficient documentation

## 2019-01-09 DIAGNOSIS — E119 Type 2 diabetes mellitus without complications: Secondary | ICD-10-CM | POA: Insufficient documentation

## 2019-01-09 DIAGNOSIS — I10 Essential (primary) hypertension: Secondary | ICD-10-CM | POA: Diagnosis not present

## 2019-01-09 LAB — BASIC METABOLIC PANEL
Anion gap: 10 (ref 5–15)
BUN: 11 mg/dL (ref 6–20)
CO2: 28 mmol/L (ref 22–32)
Calcium: 9.1 mg/dL (ref 8.9–10.3)
Chloride: 99 mmol/L (ref 98–111)
Creatinine, Ser: 0.92 mg/dL (ref 0.44–1.00)
GFR calc Af Amer: >60 mL/min (ref >60)
GFR calc non Af Amer: >60 mL/min (ref >60)
Glucose, Bld: 178 mg/dL — ABNORMAL HIGH (ref 70–99)
Potassium: 4 mmol/L (ref 3.5–5.1)
Sodium: 137 mmol/L (ref 135–145)

## 2019-01-09 LAB — CBC
HCT: 37.7 % (ref 36.0–46.0)
Hemoglobin: 12.4 g/dL (ref 12.0–15.0)
MCH: 24.2 pg — ABNORMAL LOW (ref 26.0–34.0)
MCHC: 32.9 g/dL (ref 30.0–36.0)
MCV: 73.6 fL — ABNORMAL LOW (ref 80.0–100.0)
Platelets: 294 10*3/uL (ref 150–400)
RBC: 5.12 MIL/uL — ABNORMAL HIGH (ref 3.87–5.11)
RDW: 14.4 % (ref 11.5–15.5)
WBC: 10.7 10*3/uL — ABNORMAL HIGH (ref 4.0–10.5)
nRBC: 0 % (ref 0.0–0.2)

## 2019-01-09 LAB — TROPONIN I (HIGH SENSITIVITY): Troponin I (High Sensitivity): <2 ng/L (ref <18)

## 2019-01-09 MED ORDER — SODIUM CHLORIDE 0.9% FLUSH: 3.0000 mL | Freq: Once | INTRAVENOUS | Status: DC

## 2019-01-09 NOTE — ED Triage Notes (Signed)
Mild mid chest pain began yesterday. increases with deep breath.

## 2019-01-09 NOTE — ED Notes (Signed)
Pt reports onset yesterday and believed chest pain to be indigestion, but dull central chest pain and some tenderness with inspiration did not go away

## 2019-01-10 ENCOUNTER — Other Ambulatory Visit: Payer: Self-pay

## 2019-01-10 ENCOUNTER — Emergency Department: Payer: BC Managed Care – PPO

## 2019-01-10 ENCOUNTER — Encounter: Payer: Self-pay | Admitting: Radiology

## 2019-01-10 LAB — HEPATIC FUNCTION PANEL
ALT: 13 U/L (ref 0–44)
AST: 19 U/L (ref 15–41)
Albumin: 3.4 g/dL — ABNORMAL LOW (ref 3.5–5.0)
Alkaline Phosphatase: 80 U/L (ref 38–126)
Bilirubin, Direct: 0.1 mg/dL (ref 0.0–0.2)
Indirect Bilirubin: 0.9 mg/dL (ref 0.3–0.9)
Total Bilirubin: 1 mg/dL (ref 0.3–1.2)
Total Protein: 7 g/dL (ref 6.5–8.1)

## 2019-01-10 LAB — TROPONIN I (HIGH SENSITIVITY)
Troponin I (High Sensitivity): <2 ng/L (ref <18)
Troponin I (High Sensitivity): <2 ng/L (ref <18)

## 2019-01-10 LAB — D-DIMER, QUANTITATIVE: D-Dimer, Quant: 0.53 ug/mL-FEU — ABNORMAL HIGH (ref 0.00–0.50)

## 2019-01-10 LAB — LIPASE, BLOOD: Lipase: 19 U/L (ref 11–51)

## 2019-01-10 MED ORDER — LIDOCAINE VISCOUS HCL 2 % MT SOLN
15.0000 mL | Freq: Once | OROMUCOSAL | Status: AC
  Administered 2019-01-10: 15 mL via ORAL
  Filled 2019-01-10: qty 15

## 2019-01-10 MED ORDER — ONDANSETRON HCL 4 MG/2ML IJ SOLN
4.0000 mg | Freq: Once | INTRAMUSCULAR | Status: AC
  Administered 2019-01-10: 4 mg via INTRAVENOUS
  Filled 2019-01-10: qty 2

## 2019-01-10 MED ORDER — ACETAMINOPHEN 500 MG PO TABS
1000.0000 mg | ORAL_TABLET | Freq: Once | ORAL | Status: AC
  Administered 2019-01-10: 1000 mg via ORAL
  Filled 2019-01-10: qty 2

## 2019-01-10 MED ORDER — FAMOTIDINE IN NACL 20-0.9 MG/50ML-% IV SOLN
20.0000 mg | Freq: Once | INTRAVENOUS | Status: AC
  Administered 2019-01-10: 20 mg via INTRAVENOUS
  Filled 2019-01-10: qty 50

## 2019-01-10 MED ORDER — ALUM & MAG HYDROXIDE-SIMETH 200-200-20 MG/5ML PO SUSP
30.0000 mL | Freq: Once | ORAL | Status: AC
  Administered 2019-01-10: 30 mL via ORAL
  Filled 2019-01-10: qty 30

## 2019-01-10 MED ORDER — SODIUM CHLORIDE 0.9 % IV BOLUS
500.0000 mL | Freq: Once | INTRAVENOUS | Status: AC
  Administered 2019-01-10: 02:00:00 500 mL via INTRAVENOUS

## 2019-01-10 MED ORDER — ASPIRIN 81 MG PO CHEW
324.0000 mg | CHEWABLE_TABLET | Freq: Once | ORAL | Status: AC
  Administered 2019-01-10: 324 mg via ORAL
  Filled 2019-01-10: qty 4

## 2019-01-10 MED ORDER — IOHEXOL 350 MG/ML SOLN
75.0000 mL | Freq: Once | INTRAVENOUS | Status: AC | PRN
  Administered 2019-01-10: 75 mL via INTRAVENOUS

## 2019-01-10 NOTE — Discharge Instructions (Addendum)

## 2019-01-10 NOTE — ED Notes (Signed)
Pt to CT

## 2019-01-10 NOTE — ED Notes (Signed)
Patient transported to CT 

## 2019-01-10 NOTE — ED Notes (Signed)
Danelle Earthly, rn over to attempt US guided iv insertion for ct chest. Pt updated on chest scan procedure, pt verbalizes understanding.

## 2019-01-10 NOTE — ED Provider Notes (Signed)
[facility] Emergency Department Provider Note  ____________________________________________  Time seen: Approximately 1:22 AM  I have reviewed the triage vital signs and the nursing notes.   HISTORY  Chief Complaint Chest Pain   HPI Sara Buchanan is a 59 y.o. female history of diabetes, hyperlipidemia, hypertension, obesity who presents for evaluation of chest pain.  Patient has been have intermittent chest pain since yesterday.  She describes the pain as heaviness/pressure in the center of her chest.  She reports that she had an episode yesterday but that resolved in the evening time.  She thought it was due to reflux.  She denies a history of reflux.  This morning she woke up and the pain was back.  She went out to lunch and after that the pain became worse.  Currently the pain is mild.  She reports that she had mild nausea, dizziness, shortness of breath yesterday but none today.  She denies any personal or family history of heart attacks, she is not a smoker.  She denies pleuritic chest pain, personal or family history of blood clots, recent travel immobilization, hemoptysis, or exogenous hormones.   Patient has been complaining of intermittent right lower extremity pain which she describes as an intermittent sharp pain for the last several weeks.  No cough or fever.  She has been exposed to Covid when her daughter tested positive.  She has had 3 tests since then and they were all negative.  Past Medical History:  Diagnosis Date   Diabetes (HCC)    High cholesterol    Thyroid condition     Past Surgical History:  Procedure Laterality Date   FOOT SURGERY Bilateral     Prior to Admission medications   Medication Sig Start Date End Date Taking? Authorizing Provider  albuterol (PROVENTIL HFA;VENTOLIN HFA) 108 (90 BASE) MCG/ACT inhaler Inhale 1-2 puffs into the lungs every 4 (four) hours as needed for wheezing or shortness of breath. Or cough  11/12/14  Yes Rae Halsted, PA-C  carvedilol (COREG) 6.25 MG tablet Take 6.25 mg by mouth daily.   Yes [provider]  LEVEMIR FLEXTOUCH 100 UNIT/ML Pen Inject 40 Units into the skin 2 (two) times daily.  08/12/16  Yes [provider]  levothyroxine (SYNTHROID, LEVOTHROID) 75 MCG tablet Take 75 mcg by mouth daily before breakfast.   Yes [provider]  Liraglutide (VICTOZA) 18 MG/3ML SOPN Inject 1.8 mLs into the skin 2 (two) times daily.   Yes [provider]  lisinopril (PRINIVIL,ZESTRIL) 10 MG tablet Take 10 mg by mouth daily.   Yes [provider]  metFORMIN (GLUCOPHAGE) 500 MG tablet Take by mouth 2 (two) times daily with a meal.   Yes [provider]  Multiple Vitamin (MULTIVITAMIN) tablet Take 1 tablet by mouth daily.   Yes [provider]  NOVOFINE 32G X 6 MM MISC  08/01/14  Yes [provider]  Vitamin D, Ergocalciferol, (DRISDOL) 50000 UNITS CAPS capsule  08/03/14  Yes [provider]  FLUARIX QUADRIVALENT 0.5 ML injection  10/08/16   [provider]  insulin detemir (LEVEMIR) 100 UNIT/ML injection Inject 40 Units into the skin 2 (two) times daily.     [provider]  lovastatin (MEVACOR) 20 MG tablet Take 40 mg by mouth at bedtime.     [provider]  meloxicam (MOBIC) 15 MG tablet Take 1 tablet (15 mg total) by mouth daily. Patient not taking: Reported on 01/09/2019 02/28/14   [employer], [place]  T, DPM    Allergies Patient has no known allergies.  Family History  Problem Relation Age of Onset   Diabetes Mother    Hypertension Mother    Hypertension Father    Cancer Father    Breast cancer Brother     Social History Social History   Tobacco Use   Smoking status: Never Smoker   Smokeless tobacco: Never Used  Substance Use Topics   Alcohol use: No   Drug use: Never    Review of Systems  Constitutional: Negative for fever. Eyes: Negative for visual changes. ENT:  Negative for sore throat. Neck: No neck pain  Cardiovascular: + chest pain. Respiratory: Negative for shortness of breath. Gastrointestinal: Negative for abdominal pain, vomiting or diarrhea. Genitourinary: Negative for dysuria. Musculoskeletal: Negative for back pain. + RLE pain Skin: Negative for rash. Neurological: Negative for headaches, weakness or numbness. Psych: No SI or HI  ____________________________________________   PHYSICAL EXAM:  VITAL SIGNS: ED Triage Vitals  Enc Vitals Group     BP 01/09/19 1838 (!) 135/56     Pulse Rate 01/09/19 1838 70     Resp 01/09/19 1838 18     Temp 01/09/19 1838 98.4 F (36.9 C)     Temp Source 01/09/19 1838 Oral     SpO2 01/09/19 1838 99 %     Weight 01/09/19 1841 270 lb (122.5 kg)     Height 01/09/19 1841 5\' 4"  (1.626 m)     Head Circumference --      Peak Flow --      Pain Score 01/09/19 1841 5     Pain Loc --      Pain Edu? --      Excl. in [place]? --     Constitutional: Alert and oriented. Well appearing and in no apparent distress. HEENT:      Head: Normocephalic and atraumatic.         Eyes: Conjunctivae are normal. Sclera is non-icteric.       Mouth/Throat: Mucous membranes are moist.       Neck: Supple with no signs of meningismus. Cardiovascular: Regular rate and rhythm. No murmurs, gallops, or rubs. 2+ symmetrical distal pulses are present in all extremities. No JVD. Respiratory: Normal respiratory effort. Lungs are clear to auscultation bilaterally. No wheezes, crackles, or rhonchi.  Gastrointestinal: Soft, non tender, and non distended with positive bowel sounds. No rebound or guarding. Musculoskeletal: Nontender with normal range of motion in all extremities. No edema, cyanosis, or erythema of extremities. Intact distal pulses Neurologic: Normal speech and language. Face is symmetric. Moving all extremities. No gross focal neurologic deficits are appreciated. Skin: Skin is warm, dry and intact. No rash  noted. Psychiatric: Mood and affect are normal. Speech and behavior are normal.  ____________________________________________   LABS (all labs ordered are listed, but only abnormal results are displayed)  Labs Reviewed  BASIC METABOLIC PANEL - Abnormal; Notable for the following components:      Result Value   Glucose, Bld 178 (*)    All other components within normal limits  CBC - Abnormal; Notable for the following components:   WBC 10.7 (*)    RBC 5.12 (*)    MCV 73.6 (*)    MCH 24.2 (*)    All other components within normal limits  HEPATIC FUNCTION PANEL - Abnormal; Notable for the following components:   Albumin 3.4 (*)    All other components within normal limits  LIPASE, BLOOD  D-DIMER, QUANTITATIVE (NOT AT  [facility])  POC URINE PREG, ED  TROPONIN I (HIGH SENSITIVITY)  TROPONIN I (HIGH SENSITIVITY)  TROPONIN I (HIGH SENSITIVITY)   ____________________________________________  EKG  ED ECG REPORT I, Nita Sickle, the attending physician, personally viewed and interpreted this ECG.  18:55 - Normal sinus rhythm, rate of 68, normal intervals, normal axis, no ST elevations or depressions.  Normal EKG.  00:50 -normal sinus rhythm, rate of 67, normal intervals, normal axis, no ST elevations or depressions.  Unchanged from initial. ____________________________________________  RADIOLOGY  I have personally reviewed the images performed during this visit and I agree with the Radiologist's read.   Interpretation by Radiologist:  DG Chest 2 View  Result Date: 01/09/2019 CLINICAL DATA:  Chest pain EXAM: CHEST - 2 VIEW COMPARISON:  None. FINDINGS: The heart size and mediastinal contours are within normal limits. Both lungs are clear. The visualized skeletal structures are unremarkable. IMPRESSION: No active cardiopulmonary disease. Electronically Signed   By: Alcide Clever M.D.   On: 01/09/2019 19:16   US Venous Img Lower Bilateral  Result Date: 01/10/2019 CLINICAL DATA:   Chest pain. Rule out DVT. Inconclusive chest CTA for PE. EXAM: BILATERAL LOWER EXTREMITY VENOUS DOPPLER ULTRASOUND TECHNIQUE: Gray-scale sonography with graded compression, as well as color Doppler and duplex ultrasound were performed to evaluate the lower extremity deep venous systems from the level of the common femoral vein and including the common femoral, femoral, profunda femoral, popliteal and calf veins including the posterior tibial, peroneal and gastrocnemius veins when visible. The superficial great saphenous vein was also interrogated. Spectral Doppler was utilized to evaluate flow at rest and with distal augmentation maneuvers in the common femoral, femoral and popliteal veins. COMPARISON:  Chest CT earlier this day. FINDINGS: RIGHT LOWER EXTREMITY Common Femoral Vein: No evidence of thrombus. Normal compressibility, respiratory phasicity and response to augmentation. Saphenofemoral Junction: No evidence of thrombus. Normal compressibility and flow on color Doppler imaging. Profunda Femoral Vein: No evidence of thrombus. Normal compressibility and flow on color Doppler imaging. Femoral Vein: No evidence of thrombus. Normal compressibility, respiratory phasicity and response to augmentation. Popliteal Vein: No evidence of thrombus. Normal compressibility, respiratory phasicity and response to augmentation. Calf Veins: No evidence of thrombus. Normal compressibility and flow on color Doppler imaging. Superficial Great Saphenous Vein: No evidence of thrombus. Normal compressibility. Venous Reflux:  None. Other Findings:  None. LEFT LOWER EXTREMITY Common Femoral Vein: No evidence of thrombus. Normal compressibility, respiratory phasicity and response to augmentation. Saphenofemoral Junction: No evidence of thrombus. Normal compressibility and flow on color Doppler imaging. Profunda Femoral Vein: No evidence of thrombus. Normal compressibility and flow on color Doppler imaging. Femoral Vein: No evidence of  thrombus. Normal compressibility, respiratory phasicity and response to augmentation. Popliteal Vein: No evidence of thrombus. Normal compressibility, respiratory phasicity and response to augmentation. Calf Veins: No evidence of thrombus. Normal compressibility and flow on color Doppler imaging. Superficial Great Saphenous Vein: No evidence of thrombus. Normal compressibility. Venous Reflux:  None. Other Findings:  None. IMPRESSION: No evidence of deep venous thrombosis in either lower extremity. Electronically Signed   By: Narda Rutherford M.D.   On: 01/10/2019 05:22   CT ANGIO CHEST AORTA W/CM & OR WO/CM  Result Date: 01/10/2019 CLINICAL DATA:  Chest pain EXAM: CT ANGIOGRAPHY CHEST WITH CONTRAST TECHNIQUE: Multidetector CT imaging of the chest was performed using the standard protocol during bolus administration of intravenous contrast. Multiplanar CT image reconstructions and MIPs were obtained to evaluate the vascular anatomy. CONTRAST:  80mL OMNIPAQUE IOHEXOL 350 MG/ML  SOLN COMPARISON:  Chest x-ray 01/09/2019 FINDINGS: Cardiovascular: Non contrasted images of the chest demonstrate no acute intramural hematoma. Aorta is nonaneurysmal. No dissection is seen. The heart size is normal. No pericardial effusion. Mediastinum/Nodes: No enlarged mediastinal, hilar, or axillary lymph nodes. Thyroid gland, trachea, and esophagus demonstrate no significant findings. Lungs/Pleura: Lungs are clear. No pleural effusion or pneumothorax. Upper Abdomen: No acute abnormality. Musculoskeletal: No chest wall abnormality. No acute or significant osseous findings. Review of the MIP images confirms the above findings. IMPRESSION: Negative for acute aortic dissection or aneurysm. Clear lung fields. Electronically Signed   By: Jasmine Pang M.D.   On: 01/10/2019 03:39     ____________________________________________   PROCEDURES  Procedure(s) performed: None Procedures Critical Care performed:   None ____________________________________________   INITIAL IMPRESSION / ASSESSMENT AND PLAN / ED COURSE  59 y.o. female history of diabetes, hyperlipidemia, hypertension, obesity who presents for evaluation of intermittent chest pain for 2 days.  Ddx ACS, GERD, PUD, gastritis, PE. Aortic dissection considered, however there were with no typical symptoms of chest pain radiating through to intrascapular back, no severe hypertension, symmetric bilateral radial pulses, no associated neurologic deficits, no associated abdominal or lower extremity symptoms, no marfanoid features or evidence of underlying connective tissue disorder, no evidence of mediastinal widening.   EKG x 2 normal. Troponin x 2 negative. CXR negative for edema, pneumonia, pneumothorax.  Labs showing mild leukocytosis with white count of 10.7, mild hyperglycemia with no evidence of DKA, no electrolyte derangements or dehydration.  Due to persistent chest pain a D-dimer was sent which is pending.  Patient was given a full dose aspirin and treated with a GI cocktail, Zofran and IV Pepcid for possible reflux.  Will reassess after medications.     _________________________ 5:41 AM on 01/10/2019 -----------------------------------------  Patient felt improved after GI cocktail, Zofran, and IV Pepcid.  Troponin x3 -.  CTA with no evidence of dissection or PE.  Dopplers of bilateral lower extremities negative for DVT.  At this time patient was stable for outpatient follow-up.  I am referring her to Dr. Park Breed from cardiology for further evaluation.  Discussed strict return precautions for recurrence of pain, new pain, shortness of breath, dizziness.    As part of my medical decision making, I reviewed the following data within the electronic MEDICAL RECORD NUMBER Nursing notes reviewed and incorporated, Labs reviewed , EKG interpreted , Old chart reviewed, Radiograph reviewed , Notes from prior ED visits and [place] Controlled Substance  Database   Please note:  Patient was evaluated in Emergency Department today for the symptoms described in the history of present illness. Patient was evaluated in the context of the global COVID-19 pandemic, which necessitated consideration that the patient might be at risk for infection with the SARS-CoV-2 virus that causes COVID-19. Institutional protocols and algorithms that pertain to the evaluation of patients at risk for COVID-19 are in a state of rapid change based on information released by regulatory bodies including the CDC and federal and state organizations. These policies and algorithms were followed during the patient's care in the ED.  Some ED evaluations and interventions may be delayed as a result of limited staffing during the pandemic.   ____________________________________________   FINAL CLINICAL IMPRESSION(S) / ED DIAGNOSES   Final diagnoses:  Chest pain, unspecified type      NEW MEDICATIONS STARTED DURING THIS VISIT:  ED Discharge Orders    None       Note:  This document was prepared using  [employer] engineervoice recognition software and may include unintentional dictation errors.    Don PerkingVeronese, [place], MD 01/10/19 [phone]

## 2019-01-10 NOTE — ED Notes (Signed)
Po fluids provided. Pt updated on ultrasound process. Pt verbalizes understanding.

## 2019-01-10 NOTE — ED Notes (Signed)
Iv tearm here for consult

## 2019-01-10 NOTE — ED Notes (Signed)
Signature pad unavailable.  

## 2019-01-10 NOTE — ED Notes (Signed)
Patient transported to Ultrasound 

## 2019-02-25 ENCOUNTER — Ambulatory Visit: Payer: BC Managed Care – PPO

## 2019-03-01 ENCOUNTER — Ambulatory Visit: Payer: BC Managed Care – PPO

## 2020-05-03 ENCOUNTER — Other Ambulatory Visit: Payer: Self-pay

## 2020-05-03 ENCOUNTER — Encounter: Payer: Self-pay | Admitting: Podiatry

## 2020-05-03 ENCOUNTER — Ambulatory Visit (INDEPENDENT_AMBULATORY_CARE_PROVIDER_SITE_OTHER): Payer: BC Managed Care – PPO | Admitting: Podiatry

## 2020-05-03 ENCOUNTER — Ambulatory Visit (INDEPENDENT_AMBULATORY_CARE_PROVIDER_SITE_OTHER): Payer: BC Managed Care – PPO

## 2020-05-03 DIAGNOSIS — Q828 Other specified congenital malformations of skin: Secondary | ICD-10-CM | POA: Diagnosis not present

## 2020-05-03 DIAGNOSIS — M722 Plantar fascial fibromatosis: Secondary | ICD-10-CM | POA: Diagnosis not present

## 2020-05-03 DIAGNOSIS — B351 Tinea unguium: Secondary | ICD-10-CM

## 2020-05-03 DIAGNOSIS — L603 Nail dystrophy: Secondary | ICD-10-CM

## 2020-05-03 DIAGNOSIS — M79676 Pain in unspecified toe(s): Secondary | ICD-10-CM

## 2020-05-03 MED ORDER — TRIAMCINOLONE ACETONIDE 40 MG/ML IJ SUSP
20.0000 mg | Freq: Once | INTRAMUSCULAR | Status: AC
  Administered 2020-05-03: 20 mg

## 2020-05-03 NOTE — Progress Notes (Signed)
Subjective:  Patient ID: Sara Buchanan, female    DOB: 15-Apr-1960,  MRN: [id number] HPI Chief Complaint  Patient presents with  . Foot Pain    Plantar/lateral heel right - aching x months, AM pain  . Callouses    Plantar/lateral 5th MPJ left - callused area  . Debridement    Trim toenails  . New Patient (Initial Visit)    Est pt 10/2016    60 y.o. female presents with the above complaint.   ROS: Denies fever chills nausea vomiting muscle aches pains calf pain back pain chest pain shortness of breath.  Past Medical History:  Diagnosis Date  . Diabetes (HCC)   . High cholesterol   . Thyroid condition    Past Surgical History:  Procedure Laterality Date  . FOOT SURGERY Bilateral     Current Outpatient Medications:  .  albuterol (PROVENTIL HFA;VENTOLIN HFA) 108 (90 BASE) MCG/ACT inhaler, Inhale 1-2 puffs into the lungs every 4 (four) hours as needed for wheezing or shortness of breath. Or cough, Disp: 1 Inhaler, Rfl: 0 .  atorvastatin (LIPITOR) 40 MG tablet, Take 1 tablet by mouth daily., Disp: , Rfl:  .  carvedilol (COREG) 6.25 MG tablet, Take 6.25 mg by mouth daily., Disp: , Rfl:  .  Cholecalciferol (VITAMIN D3) 1.25 MG (50000 UT) CAPS, Take 1 capsule by mouth once a week., Disp: , Rfl:  .  FLUARIX QUADRIVALENT 0.5 ML injection, , Disp: , Rfl:  .  insulin detemir (LEVEMIR) 100 UNIT/ML injection, Inject 40 Units into the skin 2 (two) times daily. , Disp: , Rfl:  .  LEVEMIR FLEXTOUCH 100 UNIT/ML Pen, Inject 40 Units into the skin 2 (two) times daily. , Disp: , Rfl:  .  levothyroxine (SYNTHROID, LEVOTHROID) 75 MCG tablet, Take 75 mcg by mouth daily before breakfast., Disp: , Rfl:  .  Liraglutide (VICTOZA) 18 MG/3ML SOPN, Inject 1.8 mLs into the skin 2 (two) times daily., Disp: , Rfl:  .  lisinopril (PRINIVIL,ZESTRIL) 10 MG tablet, Take 10 mg by mouth daily., Disp: , Rfl:  .  meloxicam (MOBIC) 15 MG tablet, Take 1 tablet (15 mg total) by mouth daily. (Patient not taking:  Reported on 01/09/2019), Disp: 30 tablet, Rfl: 3 .  metFORMIN (GLUCOPHAGE) 500 MG tablet, Take by mouth 2 (two) times daily with a meal., Disp: , Rfl:  .  Multiple Vitamin (MULTIVITAMIN) tablet, Take 1 tablet by mouth daily., Disp: , Rfl:  .  NOVOFINE 32G X 6 MM MISC, , Disp: , Rfl:  .  NOVOLOG FLEXPEN 100 UNIT/ML FlexPen, Inject into the skin., Disp: , Rfl:  .  Vitamin D, Ergocalciferol, (DRISDOL) 50000 UNITS CAPS capsule, , Disp: , Rfl:   No Known Allergies Review of Systems Objective:  There were no vitals filed for this visit.  General: Well developed, nourished, in no acute distress, alert and oriented x3   Dermatological: Skin is warm, dry and supple bilateral. Nails x 10 are thick yellow dystrophic possibly mycotic remaining integument appears unremarkable at this time. There are no open sores, no preulcerative lesions, no rash or signs of infection present.  She does have reactive hyperkeratotic tissue subfifth bilateral.  Vascular: Dorsalis Pedis artery and Posterior Tibial artery pedal pulses are 2/4 bilateral with immedate capillary fill time. Pedal hair growth present. No varicosities and no lower extremity edema present bilateral.   Neruologic: Grossly intact via light touch bilateral. Vibratory intact via tuning fork bilateral. Protective threshold with Semmes Wienstein monofilament intact to all pedal sites  bilateral. Patellar and Achilles deep tendon reflexes 2+ bilateral. No Babinski or clonus noted bilateral.   Musculoskeletal: No gross boney pedal deformities bilateral. No pain, crepitus, or limitation noted with foot and ankle range of motion bilateral. Muscular strength 5/5 in all groups tested bilateral.  Pain on palpation medial calcaneal tubercle right heel  Gait: Unassisted, Nonantalgic.    Radiographs:  Radiographs taken today demonstrate soft tissue increase in density plantar fascial kidney insertion site no acute findings noted.  Assessment & Plan:    Assessment: Planter fasciitis right.  Calluses bilateral subfifth.  Nail dystrophy bilateral.  Plan: Debrided all reactive hyperkeratotic tissue debrided benign skin lesions.  Injected plantar fascia right 20 mg Kenalog 5 mg Marcaine point of maximal tenderness.  Discussed appropriate shoe gear stretching exercises ice therapy and shoe gear modifications.  Also took samples of skin and nail today to be sent for pathologic evaluation.  Follow-up with her in 1 month     Sara Buchanan, [place]

## 2020-05-30 ENCOUNTER — Other Ambulatory Visit: Payer: Self-pay

## 2020-05-30 ENCOUNTER — Other Ambulatory Visit (HOSPITAL_COMMUNITY)
Admission: RE | Admit: 2020-05-30 | Discharge: 2020-05-30 | Disposition: A | Discharge status: Home / Self Care | Payer: BC Managed Care – PPO | Source: Ambulatory Visit | Attending: Obstetrics and Gynecology | Admitting: Obstetrics and Gynecology

## 2020-05-30 ENCOUNTER — Ambulatory Visit: Payer: BC Managed Care – PPO | Admitting: Obstetrics and Gynecology

## 2020-05-30 ENCOUNTER — Encounter: Payer: Self-pay | Admitting: Obstetrics and Gynecology

## 2020-05-30 VITALS — BP 149/78 | HR 76 | Ht 64.0 in | Wt 265.8 lb

## 2020-05-30 DIAGNOSIS — N950 Postmenopausal bleeding: Secondary | ICD-10-CM | POA: Diagnosis present

## 2020-05-30 NOTE — Progress Notes (Signed)
HPI:      [name] is a 60 y.o. G0P0000 who LMP was No LMP recorded. Patient is postmenopausal.  Subjective:   She presents today with complaint of 4 days of postmenopausal bleeding.  Patient states that she has been in menopause for approximately 6 years.  She has not taking HRT.  She is not sexually active.  She reports that she has occasionally had 1 day of spotting during menopause but this is rare.  She became concerned last week when she had 4 days of vaginal bleeding. She reports that her Pap smears are up-to-date and they have been normal. She does have diabetes and takes medication for hypertension although she says that she does not have "true hypertension". She does not have children and was on birth control only during her teens.    Hx: The following portions of the patient's history were reviewed and updated as appropriate:             She  has a past medical history of Diabetes (HCC), High cholesterol, and Thyroid condition. She does not have a problem list on file. She  has a past surgical history that includes Foot surgery (Bilateral). Her family history includes Breast cancer in her brother; Cancer in her father; Diabetes in her mother; Hypertension in her father and mother. She  reports that she has never smoked. She has never used smokeless tobacco. She reports that she does not drink alcohol and does not use drugs. She has a current medication list which includes the following prescription(s): atorvastatin, vitamin d3, tresiba flextouch, levothyroxine, lisinopril, novolog flexpen, vitamin d (ergocalciferol), albuterol, carvedilol, insulin detemir, levemir flextouch, liraglutide, meloxicam, metformin, multivitamin, and novofine. She is allergic to metformin and related.       Review of Systems:  Review of Systems  Constitutional: Denied constitutional symptoms, night sweats, recent illness, fatigue, fever, insomnia and weight loss.  Eyes: Denied eye symptoms,  eye pain, photophobia, vision change and visual disturbance.  Ears/Nose/Throat/Neck: Denied ear, nose, throat or neck symptoms, hearing loss, nasal discharge, sinus congestion and sore throat.  Cardiovascular: Denied cardiovascular symptoms, arrhythmia, chest pain/pressure, edema, exercise intolerance, orthopnea and palpitations.  Respiratory: Denied pulmonary symptoms, asthma, pleuritic pain, productive sputum, cough, dyspnea and wheezing.  Gastrointestinal: Denied, gastro-esophageal reflux, melena, nausea and vomiting.  Genitourinary: See HPI for additional information.  Musculoskeletal: Denied musculoskeletal symptoms, stiffness, swelling, muscle weakness and myalgia.  Dermatologic: Denied dermatology symptoms, rash and scar.  Neurologic: Denied neurology symptoms, dizziness, headache, neck pain and syncope.  Psychiatric: Denied psychiatric symptoms, anxiety and depression.  Endocrine: Denied endocrine symptoms including hot flashes and night sweats.   Meds:   Current Outpatient Medications on File Prior to Visit  Medication Sig Dispense Refill  . atorvastatin (LIPITOR) 40 MG tablet Take 1 tablet by mouth daily.    . Cholecalciferol (VITAMIN D3) 1.25 MG (50000 UT) CAPS Take 1 capsule by mouth once a week.    . insulin degludec (TRESIBA FLEXTOUCH) 100 UNIT/ML FlexTouch Pen Inject into the skin daily.    Marland Kitchen levothyroxine (SYNTHROID, LEVOTHROID) 75 MCG tablet Take 75 mcg by mouth daily before breakfast.    . lisinopril (PRINIVIL,ZESTRIL) 10 MG tablet Take 10 mg by mouth daily.    Marland Kitchen NOVOLOG FLEXPEN 100 UNIT/ML FlexPen Inject into the skin.    . Vitamin D, Ergocalciferol, (DRISDOL) 50000 UNITS CAPS capsule     . albuterol (PROVENTIL HFA;VENTOLIN HFA) 108 (90 BASE) MCG/ACT inhaler Inhale 1-2 puffs into the lungs every 4 (  four) hours as needed for wheezing or shortness of breath. Or cough (Patient not taking: Reported on 05/30/2020) 1 Inhaler 0  . carvedilol (COREG) 6.25 MG tablet Take 6.25 mg by  mouth daily. (Patient not taking: Reported on 05/30/2020)    . insulin detemir (LEVEMIR) 100 UNIT/ML injection Inject 40 Units into the skin 2 (two) times daily.  (Patient not taking: Reported on 05/30/2020)    . LEVEMIR FLEXTOUCH 100 UNIT/ML Pen Inject 40 Units into the skin 2 (two) times daily.  (Patient not taking: Reported on 05/30/2020)    . liraglutide (VICTOZA) 18 MG/3ML SOPN Inject 1.8 mLs into the skin 2 (two) times daily. (Patient not taking: Reported on 05/30/2020)    . meloxicam (MOBIC) 15 MG tablet Take 1 tablet (15 mg total) by mouth daily. (Patient not taking: No sig reported) 30 tablet 3  . metFORMIN (GLUCOPHAGE) 500 MG tablet Take by mouth 2 (two) times daily with a meal. (Patient not taking: Reported on 05/30/2020)    . Multiple Vitamin (MULTIVITAMIN) tablet Take 1 tablet by mouth daily. (Patient not taking: Reported on 05/30/2020)    . NOVOFINE 32G X 6 MM MISC  (Patient not taking: Reported on 05/30/2020)     No current facility-administered medications on file prior to visit.          Objective:     Vitals:   05/30/20 1337  BP: (!) 149/78  Pulse: 76   Filed Weights   05/30/20 1337  Weight: 265 lb 12.8 oz (120.6 kg)              Physical examination   Pelvic:   Vulva: Normal appearance.  No lesions.  Vagina: No lesions or abnormalities noted.  Support: Normal pelvic support.  Urethra No masses tenderness or scarring.  Meatus Normal size without lesions or prolapse.  Cervix: Normal appearance.  No lesions.  Anus: Normal exam.  No lesions.  Perineum: Normal exam.  No lesions.        Bimanual   Uterus: Normal size.  Non-tender.  Mobile.  AV.  Adnexae: No masses.  Non-tender to palpation.  Cul-de-sac: Negative for abnormality.   Endometrial Biopsy After discussion with the patient regarding her abnormal uterine bleeding I recommended that she proceed with an endometrial biopsy for further diagnosis. The risks, benefits, alternatives, and indications for an  endometrial biopsy were discussed with the patient in detail. She understood the risks including infection, bleeding, cervical laceration and uterine perforation.  Verbal consent was obtained.   PROCEDURE NOTE:  Vacurette endometrial biopsy was performed using aseptic technique with iodine preparation.  The uterus was sounded to a length of 7 cm.  Adequate sampling was obtained with minimal blood loss.  The patient tolerated the procedure well.  Disposition will be pending pathology   Assessment:    G0P0000 There are no problems to display for this patient.    1. Postmenopausal bleeding     Very little tissue obtained at the time of biopsy-doubt hyperplasia or malignancy.  No evidence of endocervical polyp.  Vagina not significantly atrophic-doubt atrophic vaginitis causing bleeding.   Plan:            1.  Await endometrial biopsy findings.  2.  Patient to contact us if she has further postmenopausal bleeding episodes. Orders No orders of the defined types were placed in this encounter.   No orders of the defined types were placed in this encounter.     F/U  Return for We will contact  her with any abnormal test results. I spent 33 minutes involved in the care of this patient preparing to see the patient by obtaining and reviewing her medical history (including labs, imaging tests and prior procedures), documenting clinical information in the electronic health record (EHR), counseling and coordinating care plans, writing and sending prescriptions, ordering tests or procedures and directly communicating with the patient by discussing pertinent items from her history and physical exam as well as detailing my assessment and plan as noted above so that she has an informed understanding.  All of her questions were answered.  Elonda Husky, M.D. 05/30/2020 2:34 PM

## 2020-05-31 ENCOUNTER — Ambulatory Visit: Payer: BC Managed Care – PPO | Admitting: Podiatry

## 2020-05-31 ENCOUNTER — Encounter: Payer: Self-pay | Admitting: Podiatry

## 2020-05-31 DIAGNOSIS — M79676 Pain in unspecified toe(s): Secondary | ICD-10-CM | POA: Diagnosis not present

## 2020-05-31 DIAGNOSIS — B351 Tinea unguium: Secondary | ICD-10-CM

## 2020-05-31 DIAGNOSIS — L603 Nail dystrophy: Secondary | ICD-10-CM

## 2020-05-31 DIAGNOSIS — Z79899 Other long term (current) drug therapy: Secondary | ICD-10-CM

## 2020-05-31 MED ORDER — TERBINAFINE HCL 250 MG PO TABS: 250.0000 mg | ORAL_TABLET | Freq: Every day | ORAL | 0 refills | Status: DC

## 2020-05-31 NOTE — Progress Notes (Signed)
She presents today for follow-up of her pathology.  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  Assessment: Onychomycosis.  Plan: We discussed the pros and cons of topical therapy oral therapy and laser therapy today.  At this point she would like to go ahead and try the oral therapy.  We will get her started on this after a complete metabolic panel.  We will go ahead and write a 30-day dose.  We discussed pros and cons of use of this medication and safety profile I will follow-up with her in 1 month.  Should her blood work come back abnormal we will notify her immediately.

## 2020-05-31 NOTE — Patient Instructions (Signed)
Terbinafine tablets What is this medicine? TERBINAFINE (TER bin a feen) is an antifungal medicine. It is used to treat certain kinds of fungal or yeast infections. This medicine may be used for other purposes; ask your health care provider or pharmacist if you have questions. COMMON BRAND NAME(S): Lamisil, Terbinex What should I tell my health care provider before I take this medicine? They need to know if you have any of these conditions:  drink alcoholic beverages  kidney disease  liver disease  an unusual or allergic reaction to terbinafine, other medicines, foods, dyes, or preservatives  pregnant or trying to get pregnant  breast-feeding How should I use this medicine? Take this medicine by mouth with a full glass of water. Follow the directions on the prescription label. You can take this medicine with food or on an empty stomach. Take your medicine at regular intervals. Do not take your medicine more often than directed. Do not skip doses or stop your medicine early even if you feel better. Do not stop taking except on your doctor's advice. A special MedGuide will be given to you by the pharmacist with each prescription and refill. Be sure to read this information carefully each time. Talk to your pediatrician regarding the use of this medicine in children. Special care may be needed. Overdosage: If you think you have taken too much of this medicine contact a poison control center or emergency room at once. NOTE: This medicine is only for you. Do not share this medicine with others. What if I miss a dose? If you miss a dose, take it as soon as you can. If it is almost time for your next dose, take only that dose. Do not take double or extra doses. What may interact with this medicine? Do not take this medicine with any of the following medications:  thioridazine This medicine may also interact with the following  medications:  beta-blockers  caffeine  cimetidine  cyclosporine  medicines for depression, anxiety, or psychotic disturbances  medicines for fungal infections like fluconazole and ketoconazole  medicines for irregular heartbeat like amiodarone, flecainide and propafenone  rifampin  warfarin This list may not describe all possible interactions. Give your health care provider a list of all the medicines, herbs, non-prescription drugs, or dietary supplements you use. Also tell them if you smoke, drink alcohol, or use illegal drugs. Some items may interact with your medicine. What should I watch for while using this medicine? Visit your doctor or health care provider regularly. Tell your doctor right away if you have nausea or vomiting, loss of appetite, stomach pain on your right upper side, yellow skin, dark urine, light stools, or are over tired. Some fungal infections need many weeks or months of treatment to cure. If you are taking this medicine for a long time, you will need to have important blood work done. This medicine may cause serious skin reactions. They can happen weeks to months after starting the medicine. Contact your health care provider right away if you notice fevers or flu-like symptoms with a rash. The rash may be red or purple and then turn into blisters or peeling of the skin. Or, you might notice a red rash with swelling of the face, lips or lymph nodes in your neck or under your arms. What side effects may I notice from receiving this medicine? Side effects that you should report to your doctor or health care professional as soon as possible:  allergic reactions like skin rash or hives,   swelling of the face, lips, or tongue  changes in vision  dark urine  fever or infection  general ill feeling or flu-like symptoms  light-colored stools  loss of appetite, nausea  rash, fever, and swollen lymph nodes  redness, blistering, peeling or loosening of the  skin, including inside the mouth  right upper belly pain  unusually weak or tired  yellowing of the eyes or skin Side effects that usually do not require medical attention (report to your doctor or health care professional if they continue or are bothersome):  changes in taste  diarrhea  hair loss  muscle or joint pain  stomach gas  stomach upset This list may not describe all possible side effects. Call your doctor for medical advice about side effects. You may report side effects to FDA at 1-800-FDA-1088. Where should I keep my medicine? Keep out of the reach of children. Store at room temperature below 25 degrees C (77 degrees F). Protect from light. Throw away any unused medicine after the expiration date. NOTE: This sheet is a summary. It may not cover all possible information. If you have questions about this medicine, talk to your doctor, pharmacist, or health care provider.  2021 Elsevier/Gold Standard (2018-04-03 15:37:07)  

## 2020-06-01 LAB — COMPREHENSIVE METABOLIC PANEL
ALT: 15 IU/L (ref 0–32)
AST: 19 IU/L (ref 0–40)
Albumin/Globulin Ratio: 1.4 (ref 1.2–2.2)
Albumin: 4.1 g/dL (ref 3.8–4.9)
Alkaline Phosphatase: 101 IU/L (ref 44–121)
BUN/Creatinine Ratio: 13 (ref 12–28)
BUN: 11 mg/dL (ref 8–27)
Bilirubin Total: 0.4 mg/dL (ref 0.0–1.2)
CO2: 26 mmol/L (ref 20–29)
Calcium: 9.4 mg/dL (ref 8.7–10.3)
Chloride: 97 mmol/L (ref 96–106)
Creatinine, Ser: 0.84 mg/dL (ref 0.57–1.00)
Globulin, Total: 2.9 g/dL (ref 1.5–4.5)
Glucose: 314 mg/dL — ABNORMAL HIGH (ref 65–99)
Potassium: 4.6 mmol/L (ref 3.5–5.2)
Sodium: 136 mmol/L (ref 134–144)
Total Protein: 7 g/dL (ref 6.0–8.5)
eGFR: 80 mL/min/{1.73_m2} (ref >59)

## 2020-06-01 LAB — SURGICAL PATHOLOGY

## 2020-07-05 ENCOUNTER — Other Ambulatory Visit: Payer: Self-pay

## 2020-07-05 ENCOUNTER — Encounter: Payer: Self-pay | Admitting: Podiatry

## 2020-07-05 ENCOUNTER — Ambulatory Visit: Payer: BC Managed Care – PPO | Admitting: Podiatry

## 2020-07-05 DIAGNOSIS — Z79899 Other long term (current) drug therapy: Secondary | ICD-10-CM

## 2020-07-05 DIAGNOSIS — L603 Nail dystrophy: Secondary | ICD-10-CM

## 2020-07-05 MED ORDER — TERBINAFINE HCL 250 MG PO TABS: 250.0000 mg | ORAL_TABLET | Freq: Every day | ORAL | 0 refills | Status: DC

## 2020-07-05 NOTE — Progress Notes (Signed)
She presents today for follow-up of her nail fungus.  She is completed her first 30 days and denies any complications with that whatsoever.  Denies fever chills nausea vomiting muscle aches pains calf pain back pain chest pain shortness of breath or itching.  States that she sees no changes in the nails as of yet.  Objective: No change in physical exam.  Assessment long-term therapy with Lamisil for onychomycosis.  Plan: We are requesting blood work for complete metabolic panel.  Should this be abnormal I will notify the patient immediately I did go ahead and write a prescription today for another 90 pills.  She received Lamisil 250 mg tablets

## 2020-07-06 LAB — COMPREHENSIVE METABOLIC PANEL
ALT: 17 IU/L (ref 0–32)
AST: 15 IU/L (ref 0–40)
Albumin/Globulin Ratio: 1.5 (ref 1.2–2.2)
Albumin: 4.4 g/dL (ref 3.8–4.9)
Alkaline Phosphatase: 111 IU/L (ref 44–121)
BUN/Creatinine Ratio: 16 (ref 12–28)
BUN: 12 mg/dL (ref 8–27)
Bilirubin Total: 0.6 mg/dL (ref 0.0–1.2)
CO2: 23 mmol/L (ref 20–29)
Calcium: 9.1 mg/dL (ref 8.7–10.3)
Chloride: 98 mmol/L (ref 96–106)
Creatinine, Ser: 0.76 mg/dL (ref 0.57–1.00)
Globulin, Total: 2.9 g/dL (ref 1.5–4.5)
Glucose: 270 mg/dL — ABNORMAL HIGH (ref 65–99)
Potassium: 4.5 mmol/L (ref 3.5–5.2)
Sodium: 137 mmol/L (ref 134–144)
Total Protein: 7.3 g/dL (ref 6.0–8.5)
eGFR: 90 mL/min/{1.73_m2} (ref >59)

## 2020-07-13 ENCOUNTER — Telehealth: Payer: Self-pay

## 2020-07-13 NOTE — Telephone Encounter (Signed)
Patient has been notified of lab results and instructed per Dr. Geryl Rankins recommendation to follow up with her PCP.  She verbalized understanding

## 2020-07-13 NOTE — Telephone Encounter (Signed)
-----   Message from Elinor Parkinson, [place] sent at 07/06/2020  7:09 AM EDT ----- Her kidney and liver levels are good however her sugar is still running high.  She should follow-up with her primary care for this.  It is okay to continue her Lamisil.

## 2020-08-31 ENCOUNTER — Ambulatory Visit: Payer: BC Managed Care – PPO | Admitting: Podiatry

## 2020-09-28 ENCOUNTER — Ambulatory Visit: Payer: BC Managed Care – PPO | Admitting: Podiatry

## 2020-11-08 ENCOUNTER — Encounter: Payer: BC Managed Care – PPO | Admitting: Podiatry

## 2020-11-09 ENCOUNTER — Ambulatory Visit: Payer: BC Managed Care – PPO | Admitting: Podiatry

## 2020-12-11 ENCOUNTER — Encounter: Payer: BC Managed Care – PPO | Admitting: Podiatry

## 2020-12-17 ENCOUNTER — Encounter: Payer: Self-pay | Admitting: Internal Medicine

## 2020-12-21 ENCOUNTER — Telehealth: Payer: Self-pay

## 2020-12-21 NOTE — Telephone Encounter (Signed)
CALLED PATIENT NO ANSWER MAILBOX FULL SO WAS UNABLE TO LEAVE A MESSAGE

## 2020-12-25 ENCOUNTER — Other Ambulatory Visit: Payer: Self-pay

## 2020-12-25 DIAGNOSIS — Z1211 Encounter for screening for malignant neoplasm of colon: Secondary | ICD-10-CM

## 2020-12-25 MED ORDER — CLENPIQ 10-3.5-12 MG-GM -GM/160ML PO SOLN: 1.0000 | Freq: Once | ORAL | 0 refills | Status: AC

## 2020-12-25 NOTE — Progress Notes (Signed)
Gastroenterology Pre-Procedure Review  Request Date: 01/04/21 Requesting Physician: Dr. Servando Snare  PATIENT REVIEW QUESTIONS: The patient responded to the following health history questions as indicated:    1. Are you having any GI issues? no 2. Do you have a personal history of Polyps?  5-6 yrs ago not sure if polyps were removed. 3. Do you have a family history of Colon Cancer or Polyps? no 4. Diabetes Mellitus? yes (Type II) 5. Joint replacements in the past 12 months?no 6. Major health problems in the past 3 months?no 7. Any artificial heart valves, MVP, or defibrillator?no    MEDICATIONS & ALLERGIES:    Patient reports the following regarding taking any anticoagulation/antiplatelet therapy:   Plavix, Coumadin, Eliquis, Xarelto, Lovenox, Pradaxa, Brilinta, or Effient? no Aspirin? no  Patient confirms/reports the following medications:  Current Outpatient Medications  Medication Sig Dispense Refill   albuterol (PROVENTIL HFA;VENTOLIN HFA) 108 (90 BASE) MCG/ACT inhaler Inhale 1-2 puffs into the lungs every 4 (four) hours as needed for wheezing or shortness of breath. Or cough (Patient not taking: Reported on 05/30/2020) 1 Inhaler 0   atorvastatin (LIPITOR) 40 MG tablet Take 1 tablet by mouth daily.     carvedilol (COREG) 6.25 MG tablet Take 6.25 mg by mouth daily. (Patient not taking: Reported on 05/30/2020)     Cholecalciferol (VITAMIN D3) 1.25 MG (50000 UT) CAPS Take 1 capsule by mouth once a week.     insulin degludec (TRESIBA FLEXTOUCH) 100 UNIT/ML FlexTouch Pen Inject into the skin daily.     insulin detemir (LEVEMIR) 100 UNIT/ML injection Inject 40 Units into the skin 2 (two) times daily.  (Patient not taking: Reported on 05/30/2020)     LEVEMIR FLEXTOUCH 100 UNIT/ML Pen Inject 40 Units into the skin 2 (two) times daily.  (Patient not taking: Reported on 05/30/2020)     levothyroxine (SYNTHROID, LEVOTHROID) 75 MCG tablet Take 75 mcg by mouth daily before breakfast.     liraglutide  (VICTOZA) 18 MG/3ML SOPN Inject 1.8 mLs into the skin 2 (two) times daily. (Patient not taking: Reported on 05/30/2020)     lisinopril (PRINIVIL,ZESTRIL) 10 MG tablet Take 10 mg by mouth daily.     meloxicam (MOBIC) 15 MG tablet Take 1 tablet (15 mg total) by mouth daily. (Patient not taking: No sig reported) 30 tablet 3   metFORMIN (GLUCOPHAGE) 500 MG tablet Take by mouth 2 (two) times daily with a meal. (Patient not taking: Reported on 05/30/2020)     Multiple Vitamin (MULTIVITAMIN) tablet Take 1 tablet by mouth daily. (Patient not taking: Reported on 05/30/2020)     NOVOFINE 32G X 6 MM MISC  (Patient not taking: Reported on 05/30/2020)     NOVOLOG FLEXPEN 100 UNIT/ML FlexPen Inject into the skin.     terbinafine (LAMISIL) 250 MG tablet Take 1 tablet (250 mg total) by mouth daily. 90 tablet 0   Vitamin D, Ergocalciferol, (DRISDOL) 50000 UNITS CAPS capsule      No current facility-administered medications for this visit.    Patient confirms/reports the following allergies:  Allergies  Allergen Reactions   Metformin And Related Diarrhea    No orders of the defined types were placed in this encounter.   AUTHORIZATION INFORMATION Primary Insurance: 1D#: Group #:  Secondary Insurance: 1D#: Group #:  SCHEDULE INFORMATION: Date: 01/04/21 Time: Location: [facility]

## 2020-12-26 ENCOUNTER — Encounter: Payer: Self-pay | Admitting: Gastroenterology

## 2020-12-28 ENCOUNTER — Telehealth: Payer: Self-pay

## 2020-12-28 NOTE — Telephone Encounter (Signed)
Returned patients call. LVM to call office back. 

## 2020-12-29 ENCOUNTER — Telehealth: Payer: Self-pay

## 2020-12-29 NOTE — Telephone Encounter (Signed)
Patient stating she needs to fax the results from her stress test to [facility]. Called Kim to get the number to where the results need to go. Pt was given information.

## 2021-01-03 ENCOUNTER — Ambulatory Visit: Payer: BC Managed Care – PPO | Admitting: Podiatry

## 2021-01-03 ENCOUNTER — Other Ambulatory Visit: Payer: Self-pay

## 2021-01-03 ENCOUNTER — Encounter: Payer: Self-pay | Admitting: Podiatry

## 2021-01-03 DIAGNOSIS — L603 Nail dystrophy: Secondary | ICD-10-CM | POA: Diagnosis not present

## 2021-01-03 MED ORDER — TERBINAFINE HCL 250 MG PO TABS: 250.0000 mg | ORAL_TABLET | Freq: Every day | ORAL | 0 refills | Status: DC

## 2021-01-03 NOTE — Progress Notes (Signed)
She presents today for follow-up of her nail fungus states that she is completed her 120 days and she is still got a few left from her June prescription.  But all in all she thinks her nails are doing some better.  Has no problems taking the medication.  Objective: Vital signs are stable alert and oriented x3.  Pulses are palpable.  There is no erythema edema cellulitis drainage or odor nails appear to be resolving by about 60 to 70%.  Assessment: Well-healing onychomycosis long-term therapy.  Plan: Encouraged her to take the medication on a daily basis basis and I wrote another prescription for 30 more pills to be taken daily and I will follow-up with her in about 4 months

## 2021-01-05 ENCOUNTER — Ambulatory Visit
Admission: RE | Admit: 2021-01-05 | Discharge: 2021-01-05 | Disposition: A | Discharge status: Home / Self Care | Payer: BC Managed Care – PPO | Attending: Gastroenterology | Admitting: Gastroenterology

## 2021-01-05 ENCOUNTER — Other Ambulatory Visit: Payer: Self-pay

## 2021-01-05 ENCOUNTER — Encounter: Payer: Self-pay | Admitting: Gastroenterology

## 2021-01-05 ENCOUNTER — Encounter
Admission: RE | Disposition: A | Discharge status: Home / Self Care | Payer: Self-pay | Source: Home / Self Care | Attending: Gastroenterology

## 2021-01-05 ENCOUNTER — Ambulatory Visit: Payer: BC Managed Care – PPO | Admitting: Anesthesiology

## 2021-01-05 DIAGNOSIS — Z86010 Personal history of colonic polyps: Secondary | ICD-10-CM | POA: Insufficient documentation

## 2021-01-05 DIAGNOSIS — Z1211 Encounter for screening for malignant neoplasm of colon: Secondary | ICD-10-CM | POA: Diagnosis not present

## 2021-01-05 HISTORY — DX: Hypothyroidism, unspecified: E03.9

## 2021-01-05 HISTORY — PX: COLONOSCOPY WITH PROPOFOL: SHX5780

## 2021-01-05 LAB — GLUCOSE, CAPILLARY
Glucose-Capillary: 184 mg/dL — ABNORMAL HIGH (ref 70–99)
Glucose-Capillary: 176 mg/dL — ABNORMAL HIGH (ref 70–99)

## 2021-01-05 SURGERY — COLONOSCOPY WITH PROPOFOL
Anesthesia: General | Site: Rectum

## 2021-01-05 MED ORDER — PROPOFOL 10 MG/ML IV BOLUS
INTRAVENOUS | Status: DC | PRN
  Administered 2021-01-05 (×2): 30 mg via INTRAVENOUS
  Administered 2021-01-05: 70 mg via INTRAVENOUS
  Administered 2021-01-05: 20 mg via INTRAVENOUS
  Administered 2021-01-05: 30 mg via INTRAVENOUS

## 2021-01-05 MED ORDER — LIDOCAINE HCL (CARDIAC) PF 100 MG/5ML IV SOSY
PREFILLED_SYRINGE | INTRAVENOUS | Status: DC | PRN
  Administered 2021-01-05: 40 mg via INTRAVENOUS

## 2021-01-05 MED ORDER — ACETAMINOPHEN 500 MG PO TABS: 1000.0000 mg | ORAL_TABLET | Freq: Once | ORAL | Status: DC | PRN

## 2021-01-05 MED ORDER — ACETAMINOPHEN 160 MG/5ML PO SOLN: 975.0000 mg | Freq: Once | ORAL | Status: DC | PRN

## 2021-01-05 MED ORDER — STERILE WATER FOR IRRIGATION IR SOLN
Status: DC | PRN
  Administered 2021-01-05: 1

## 2021-01-05 MED ORDER — SODIUM CHLORIDE 0.9 % IV SOLN: INTRAVENOUS | Status: DC

## 2021-01-05 MED ORDER — LACTATED RINGERS IV SOLN: INTRAVENOUS | Status: DC

## 2021-01-05 MED ORDER — ONDANSETRON HCL 4 MG/2ML IJ SOLN: 4.0000 mg | Freq: Once | INTRAMUSCULAR | Status: DC | PRN

## 2021-01-05 SURGICAL SUPPLY — 6 items
GOWN CVR UNV OPN BCK APRN NK (MISCELLANEOUS) ×2 IMPLANT
GOWN ISOL THUMB LOOP REG UNIV (MISCELLANEOUS) ×4
KIT PRC NS LF DISP ENDO (KITS) ×1 IMPLANT
KIT PROCEDURE OLYMPUS (KITS) ×2
MANIFOLD NEPTUNE II (INSTRUMENTS) ×2 IMPLANT
WATER STERILE IRR 250ML POUR (IV SOLUTION) ×2 IMPLANT

## 2021-01-05 NOTE — Anesthesia Procedure Notes (Signed)
Date/Time: 01/05/2021 8:10 AM Performed by: Lily Kocher, CRNA Pre-anesthesia Checklist: Patient identified, Emergency Drugs available, Suction available, Patient being monitored and Timeout performed Patient Re-evaluated:Patient Re-evaluated prior to induction Oxygen Delivery Method: Nasal cannula Induction Type: IV induction Placement Confirmation: positive ETCO2

## 2021-01-05 NOTE — Anesthesia Preprocedure Evaluation (Signed)
Anesthesia Evaluation  Patient identified by MRN, date of birth, ID band Patient awake    Reviewed: Allergy & Precautions, H&P , NPO status , Patient's Chart, lab work & pertinent test results, reviewed documented beta blocker date and time   Airway Mallampati: III  TM Distance: >3 FB Neck ROM: full    Dental no notable dental hx.    Pulmonary neg pulmonary ROS,    Pulmonary exam normal breath sounds clear to auscultation       Cardiovascular Exercise Tolerance: Good negative cardio ROS   Rhythm:regular Rate:Normal     Neuro/Psych negative neurological ROS  negative psych ROS   GI/Hepatic negative GI ROS, Neg liver ROS,   Endo/Other  negative endocrine ROSdiabetesHypothyroidism   Renal/GU negative Renal ROS  negative genitourinary   Musculoskeletal   Abdominal   Peds  Hematology negative hematology ROS (+)   Anesthesia Other Findings   Reproductive/Obstetrics negative OB ROS                             Anesthesia Physical Anesthesia Plan  ASA: 3  Anesthesia Plan: General   Post-op Pain Management:    Induction:   PONV Risk Score and Plan: 3 and Propofol infusion, TIVA and Treatment may vary due to age or medical condition  Airway Management Planned:   Additional Equipment:   Intra-op Plan:   Post-operative Plan:   Informed Consent: I have reviewed the patients History and Physical, chart, labs and discussed the procedure including the risks, benefits and alternatives for the proposed anesthesia with the patient or authorized representative who has indicated his/her understanding and acceptance.     Dental Advisory Given  Plan Discussed with: CRNA  Anesthesia Plan Comments:         Anesthesia Quick Evaluation

## 2021-01-05 NOTE — Transfer of Care (Signed)
Immediate Anesthesia Transfer of Care Note  Patient: Sara Buchanan  Procedure(s) Performed: COLONOSCOPY WITH PROPOFOL (Rectum)  Patient Location: PACU  Anesthesia Type: General  Level of Consciousness: awake, alert  and patient cooperative  Airway and Oxygen Therapy: Patient Spontanous Breathing and Patient connected to supplemental oxygen  Post-op Assessment: Post-op Vital signs reviewed, Patient's Cardiovascular Status Stable, Respiratory Function Stable, Patent Airway and No signs of Nausea or vomiting  Post-op Vital Signs: Reviewed and stable  Complications: No notable events documented.

## 2021-01-05 NOTE — Op Note (Addendum)
[facility] Gastroenterology Patient Name: Sara Buchanan Procedure Date: 01/05/2021 8:04 AM MRN: [id number] Account #: [id number] Date of Birth: [birth year] Admit Type: Outpatient Age: 60 Room: [facility] OR ROOM 01 Gender: Female Note Status: Finalized Instrument Name: [id number] Procedure:             Colonoscopy Indications:           Screening for colorectal malignant neoplasm Providers:             Midge Minium MD, MD Referring MD:          Sherrie Mustache, MD (Referring MD) Medicines:             Propofol per Anesthesia Complications:         No immediate complications. Procedure:             Pre-Anesthesia Assessment:                        - Prior to the procedure, a History and Physical was                         performed, and patient medications and allergies were                         reviewed. The patient's tolerance of previous                         anesthesia was also reviewed. The risks and benefits                         of the procedure and the sedation options and risks                         were discussed with the patient. All questions were                         answered, and informed consent was obtained. Prior                         Anticoagulants: The patient has taken no previous                         anticoagulant or antiplatelet agents. ASA Grade                         Assessment: II - A patient with mild systemic disease.                         After reviewing the risks and benefits, the patient                         was deemed in satisfactory condition to undergo the                         procedure.                        After obtaining informed consent, the colonoscope was  passed under direct vision. Throughout the procedure,                         the patient's blood pressure, pulse, and oxygen                         saturations were monitored continuously. The                          Colonoscope was introduced through the anus and                         advanced to the the cecum, identified by appendiceal                         orifice and ileocecal valve. The colonoscopy was                         performed without difficulty. The patient tolerated                         the procedure well. The quality of the bowel                         preparation was excellent. Findings:      The perianal and digital rectal examinations were normal.      The colon (entire examined portion) appeared normal. Impression:            - The entire examined colon is normal.                        - No specimens collected. Recommendation:        - Discharge patient to home.                        - Resume previous diet.                        - Continue present medications.                        - Repeat colonoscopy in 10 years for screening                         purposes. Procedure Code(s):     --- Professional ---                        [phone], Colonoscopy, flexible; diagnostic, including                         collection of specimen(s) by brushing or washing, when                         performed (separate procedure) Diagnosis Code(s):     --- Professional ---                        Z12.11, Encounter for screening for malignant neoplasm  of colon CPT copyright 2019 American [employer]. All rights reserved. The codes documented in this report are preliminary and upon coder review may  be revised to meet current compliance requirements. Midge Minium MD, MD 01/05/2021 8:26:56 AM This report has been signed electronically. Number of Addenda: 0 Note Initiated On: 01/05/2021 8:04 AM Scope Withdrawal Time: 0 hours 6 minutes 36 seconds  Total Procedure Duration: 0 hours 10 minutes 13 seconds  Estimated Blood Loss:  Estimated blood loss: none.      [facility]

## 2021-01-05 NOTE — H&P (Signed)
Lucilla Lame, MD [address] [place], [place] [address] [phone] Fax : [phone]  Primary Care Physician:  Casilda Carls, MD Primary Gastroenterologist:  Dr. Allen Norris  Pre-Procedure History & Physical: HPI:  Sara Buchanan is a 60 y.o. female is here for an colonoscopy.   Past Medical History:  Diagnosis Date   Diabetes ([place])    High cholesterol    Hypothyroidism    Thyroid condition     Past Surgical History:  Procedure Laterality Date   FOOT SURGERY Bilateral     Prior to Admission medications   Medication Sig Start Date End Date Taking? Authorizing Provider  atorvastatin (LIPITOR) 80 MG tablet Take 80 mg by mouth daily. 12/22/20  Yes [provider]  carvedilol (COREG) 6.25 MG tablet Take 6.25 mg by mouth daily.   Yes [provider]  Cholecalciferol (VITAMIN D3) 1.25 MG (50000 UT) CAPS Take 1 capsule by mouth once a week. 01/27/20  Yes [provider]  CLENPIQ 10-3.5-12 MG-GM -GM/160ML SOLN SMARTSIG:1 Kit(s) By Mouth Once 12/25/20  Yes [provider]  Dapagliflozin Propanediol (FARXIGA PO) Take by mouth daily.   Yes [provider]  insulin degludec (TRESIBA FLEXTOUCH) 100 UNIT/ML FlexTouch Pen Inject 50 Units into the skin daily.   Yes [provider]  levothyroxine (SYNTHROID, LEVOTHROID) 75 MCG tablet Take 75 mcg by mouth daily before breakfast.   Yes [provider]  lisinopril (PRINIVIL,ZESTRIL) 10 MG tablet Take 10 mg by mouth daily.   Yes [provider]  NOVOLOG FLEXPEN 100 UNIT/ML FlexPen Inject into the skin. Sliding scale with meals 03/14/20  Yes [provider]  Semaglutide (RYBELSUS PO) Take by mouth daily.   Yes [provider]  terbinafine (LAMISIL) 250 MG tablet Take 1 tablet (250 mg total) by mouth daily. 07/05/20  Yes Hyatt, Max T, DPM  Multiple Vitamin (MULTIVITAMIN) tablet Take 1 tablet by mouth daily. Patient not taking: Reported on  05/30/2020    [provider]  NOVOFINE 32G X 6 MM MISC  08/01/14   [provider]  terbinafine (LAMISIL) 250 MG tablet Take 1 tablet (250 mg total) by mouth daily. 01/03/21   Hyatt, Max T, DPM    Allergies as of 12/25/2020 - Review Complete 07/05/2020  Allergen Reaction Noted   Metformin and related Diarrhea 08/08/2014    Family History  Problem Relation Age of Onset   Diabetes Mother    Hypertension Mother    Hypertension Father    Cancer Father    Breast cancer Brother     Social History   Socioeconomic History   Marital status: Married    Spouse name: Not on file   Number of children: Not on file   Years of education: Not on file   Highest education level: Not on file  Occupational History   Not on file  Tobacco Use   Smoking status: Never   Smokeless tobacco: Never  Vaping Use   Vaping Use: Never used  Substance and Sexual Activity   Alcohol use: No    Comment: rare   Drug use: Never   Sexual activity: Not Currently    Birth control/protection: None, Post-menopausal  Other Topics Concern   Not on file  Social History Narrative   Not on file   Social Determinants of Health   Financial Resource Strain: Not on file  Food Insecurity: Not on file  Transportation Needs: Not on file  Physical Activity: Not on file  Stress: Not  on file  Social Connections: Not on file  Intimate Partner Violence: Not on file    Review of Systems: See HPI, otherwise negative ROS  Physical Exam: BP 114/65    Pulse 78    Temp 97.7 F (36.5 C) (Temporal)    Resp 18    Ht _0  (1.626 m)    Wt 116.1 kg    SpO2 100%    BMI 43.94 kg/m  General:   Alert,  pleasant and cooperative in NAD Head:  Normocephalic and atraumatic. Neck:  Supple; no masses or thyromegaly. Lungs:  Clear throughout to auscultation.    Heart:  Regular rate and rhythm. Abdomen:  Soft, nontender and nondistended. Normal bowel sounds, without guarding, and without rebound.   Neurologic:   Alert and  oriented x4;  grossly normal neurologically.  Impression/Plan: Sara Buchanan is here for an colonoscopy to be performed for a history of adenomatous polyps on'2017  Risks, benefits, limitations, and alternatives regarding  colonoscopy have been reviewed with the patient.  Questions have been answered.  All parties agreeable.   Lucilla Lame, MD  01/05/2021, 8:04 AM

## 2021-01-05 NOTE — Anesthesia Postprocedure Evaluation (Signed)
Anesthesia Post Note  Patient: Sara Buchanan  Procedure(s) Performed: COLONOSCOPY WITH PROPOFOL (Rectum)     Patient location during evaluation: PACU Anesthesia Type: General Level of consciousness: awake and alert Pain management: pain level controlled Vital Signs Assessment: post-procedure vital signs reviewed and stable Respiratory status: spontaneous breathing, nonlabored ventilation and respiratory function stable Cardiovascular status: blood pressure returned to baseline and stable Postop Assessment: no apparent nausea or vomiting Anesthetic complications: no   No notable events documented.  Loni Beckwith

## 2021-01-27 IMAGING — CT CT ANGIO CHEST
3 of 7 series · 19 of 46 positions shown · IV contrast (omnipaque)
Comparison: Chest x-ray 01/09/2019

CLINICAL DATA: Chest pain

EXAM:
CT ANGIOGRAPHY CHEST WITH CONTRAST
TECHNIQUE: Multidetector CT imaging of the chest was performed using the
standard protocol during bolus administration of intravenous
contrast. Multiplanar CT image reconstructions and MIPs were
obtained to evaluate the vascular anatomy.
CONTRAST:  75mL OMNIPAQUE IOHEXOL 350 MG/ML SOLN

[Series 4: axial arterial · axial · arterial · 0.71mm/px · z∈[-582,-369]mm · 8 of 93 slices shown]
[im 11/93  lung]
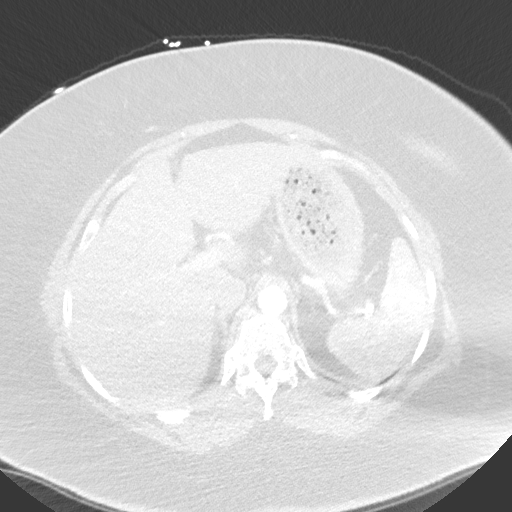
[im 21/93  soft-tissue]
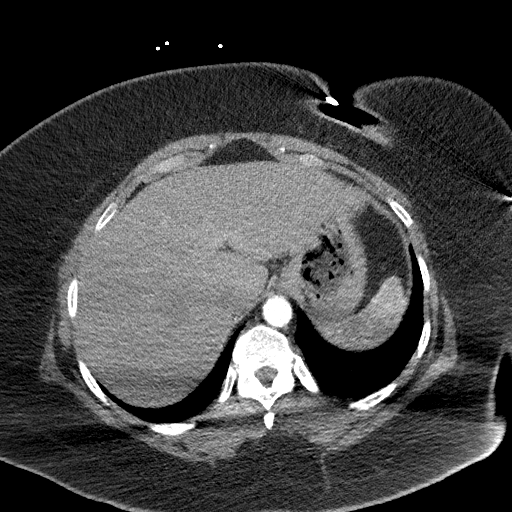
[im 31/93  lung]
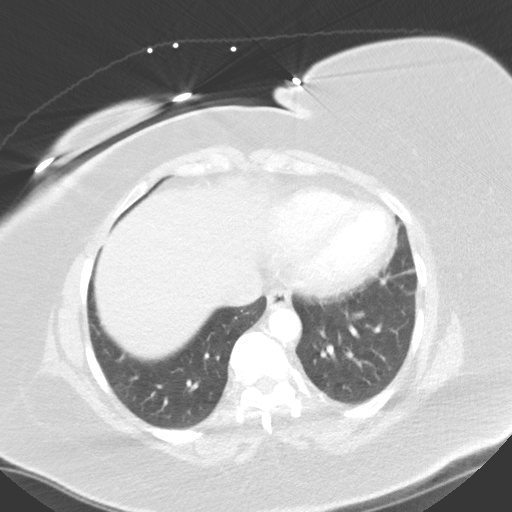
[im 41/93  soft-tissue]
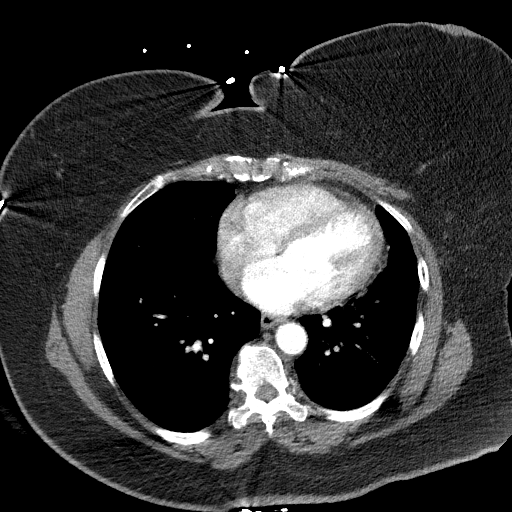
[im 52/93  lung]
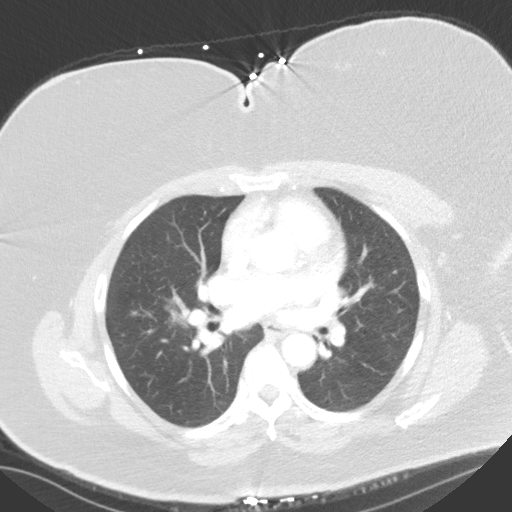
[im 62/93  soft-tissue]
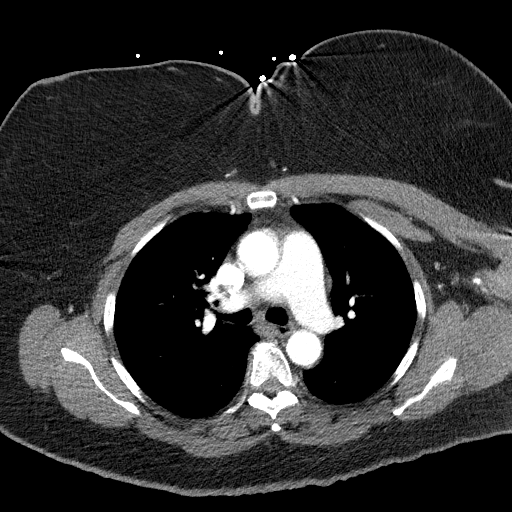
[im 72/93  lung]
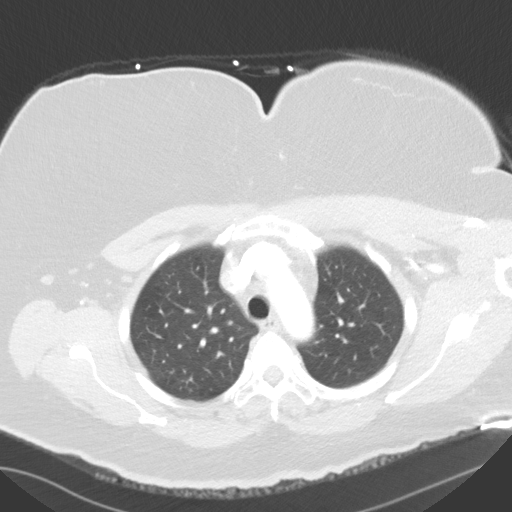
[im 82/93  soft-tissue]
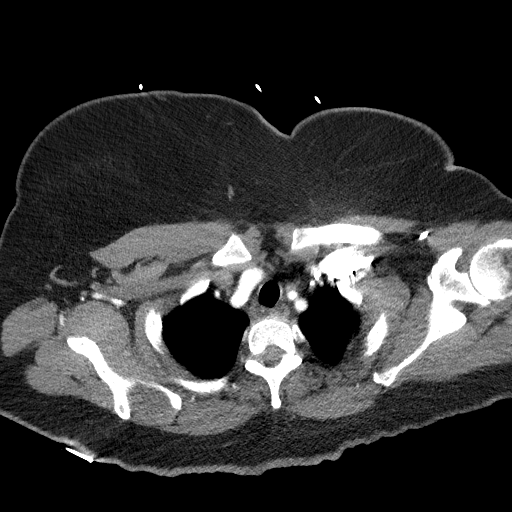

[Series 5: lung · axial · 0.71mm/px · z∈[-594,-356]mm · 8 of 139 slices shown]
[im 10/139  soft-tissue]
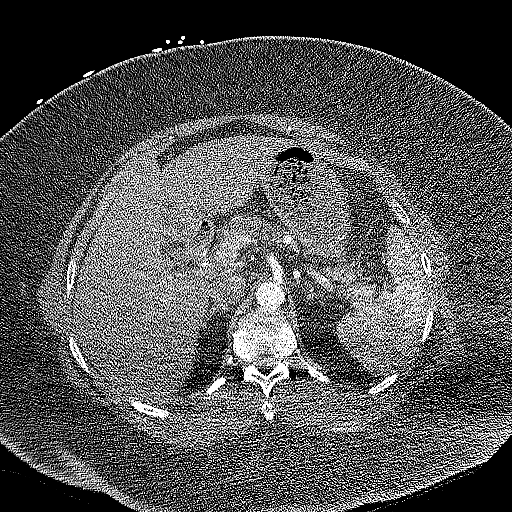
[im 28/139  soft-tissue]
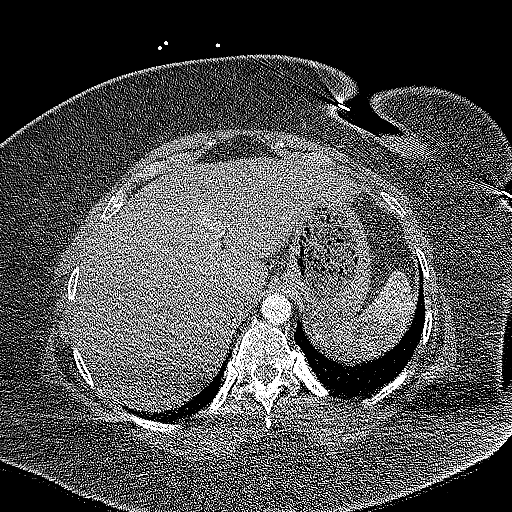
[im 47/139  soft-tissue]
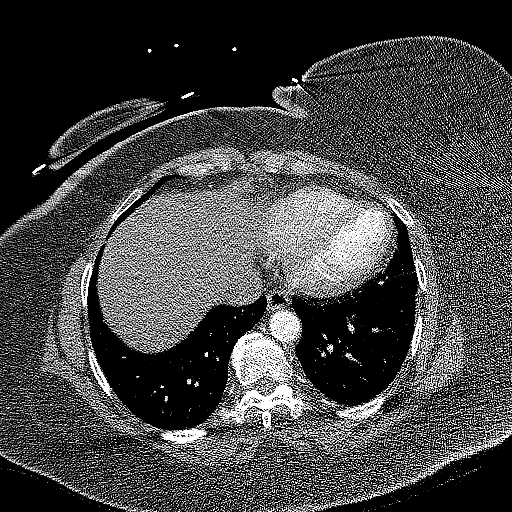
[im 65/139  soft-tissue]
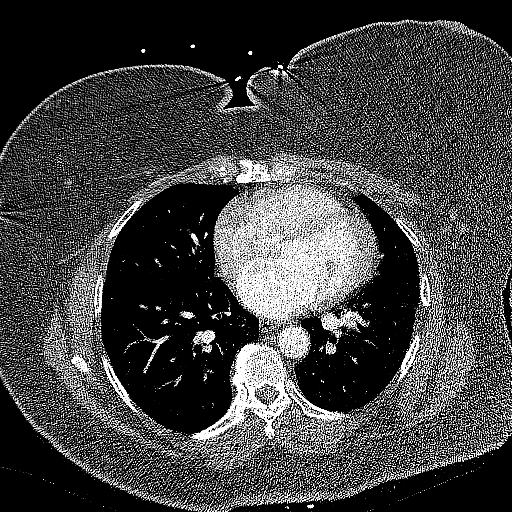
[im 74/139  soft-tissue]
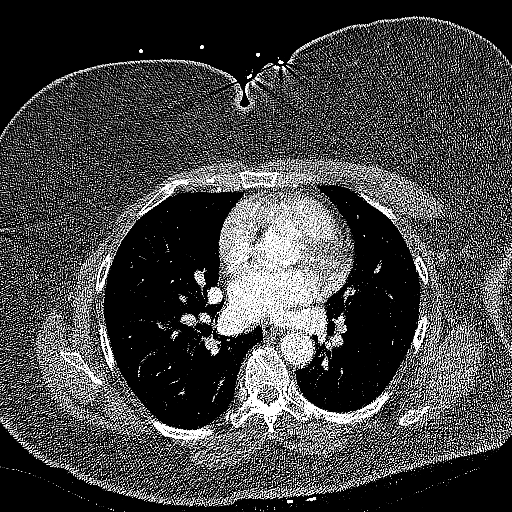
[im 93/139  soft-tissue]
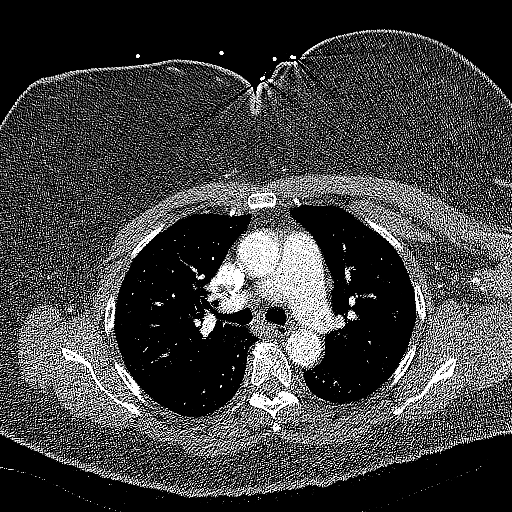
[im 111/139  soft-tissue]
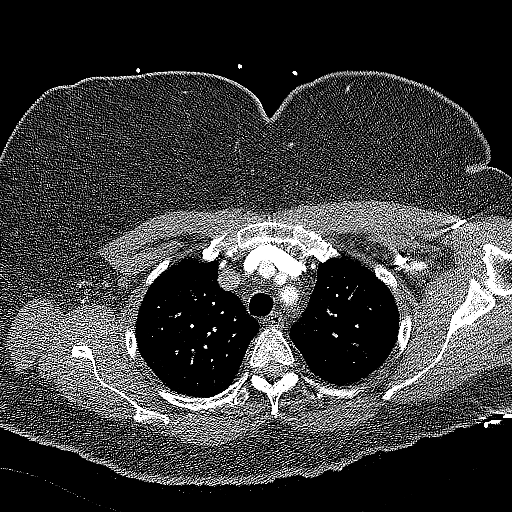
[im 129/139  soft-tissue]
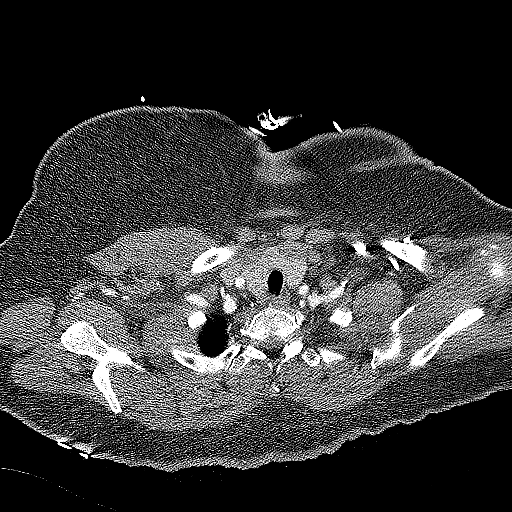

[Series 7: coronals · coronal · 0.57mm/px · 3 of 133 slices shown]
[im 34/133  soft-tissue]
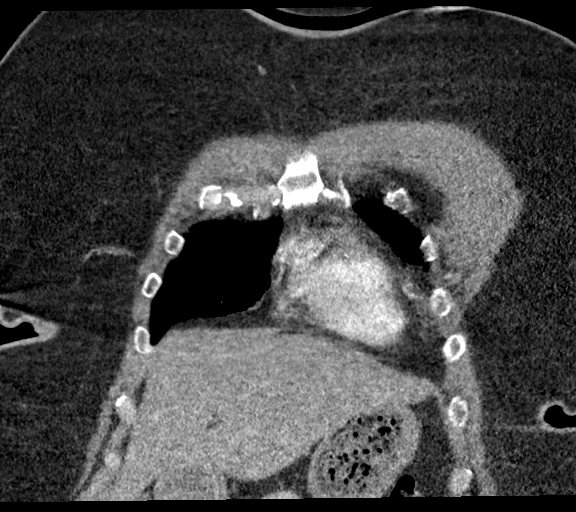
[im 67/133  soft-tissue]
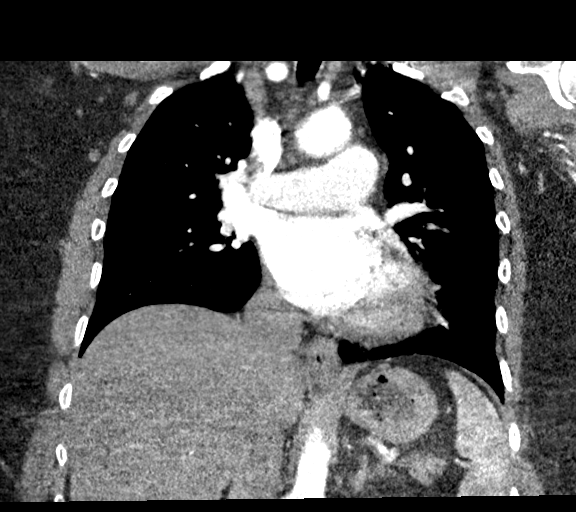
[im 100/133  soft-tissue]
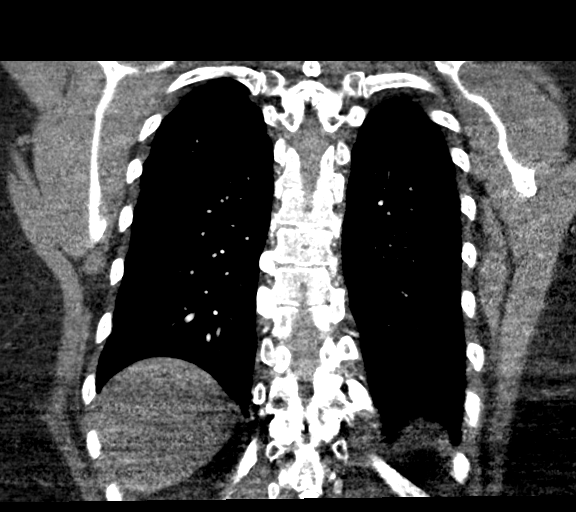

[19 of 46 positions shown; findings below may reference images not displayed]

FINDINGS: Cardiovascular: Non contrasted images of the chest demonstrate no
acute intramural hematoma. Aorta is nonaneurysmal. No dissection is
seen. The heart size is normal. No pericardial effusion.

Mediastinum/Nodes: No enlarged mediastinal, hilar, or axillary lymph
nodes. Thyroid gland, trachea, and esophagus demonstrate no
significant findings.

Lungs/Pleura: Lungs are clear. No pleural effusion or pneumothorax.

Upper Abdomen: No acute abnormality.

Musculoskeletal: No chest wall abnormality. No acute or significant
osseous findings.

Review of the MIP images confirms the above findings.
IMPRESSION: Negative for acute aortic dissection or aneurysm. Clear lung fields.

## 2021-01-27 IMAGING — US US EXTREM LOW VENOUS
1 series · 13 of 24 positions shown · non-contrast
Comparison: Chest CT earlier this day.

CLINICAL DATA: Chest pain. Rule out DVT. Inconclusive chest CTA for
PE.



[Series 1: us extrem low venous · 13 of 60 slices shown]
[im 1/60]
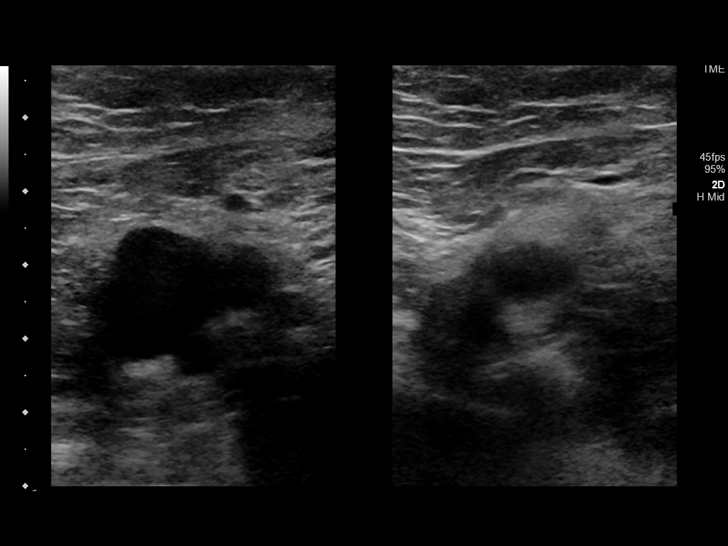
[im 6/60]
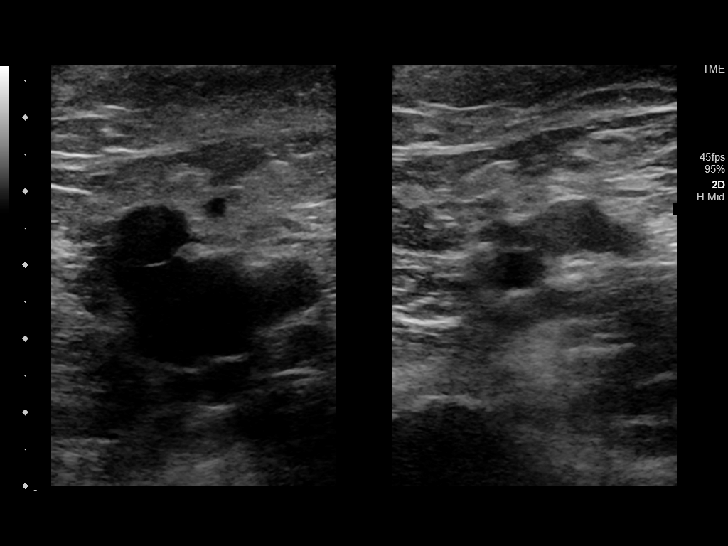
[im 11/60]
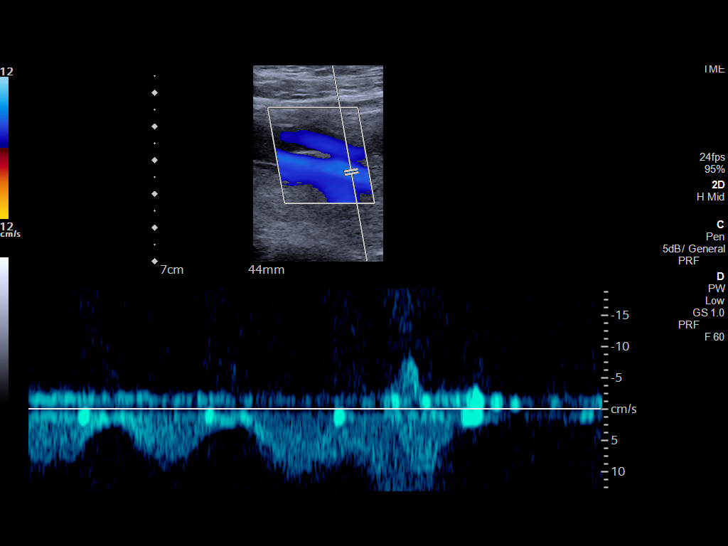
[im 16/60]
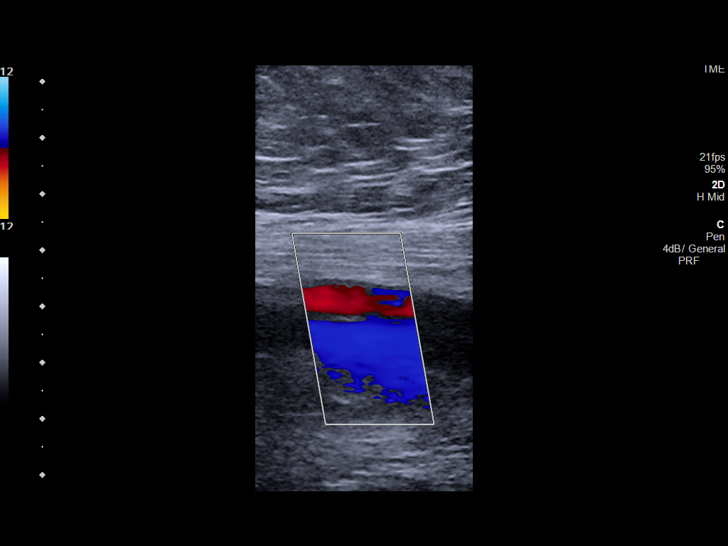
[im 21/60]
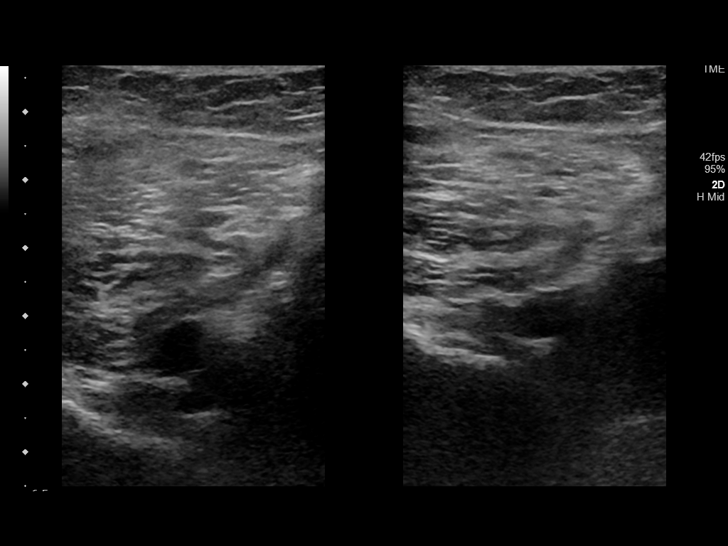
[im 26/60]
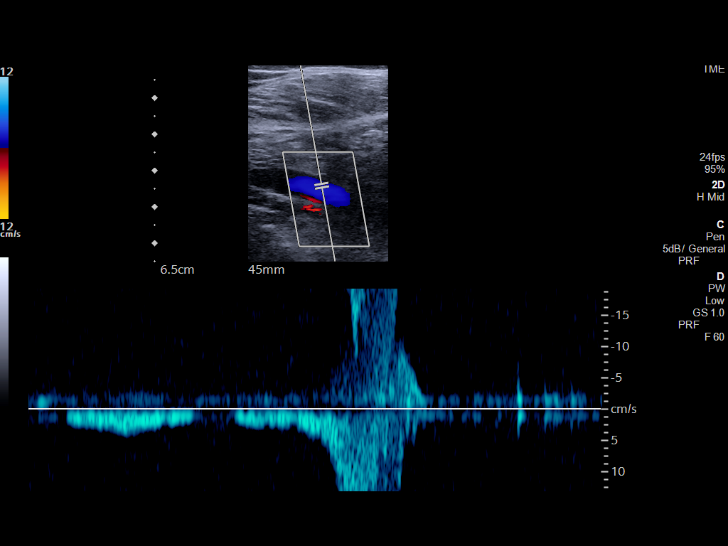
[im 31/60]
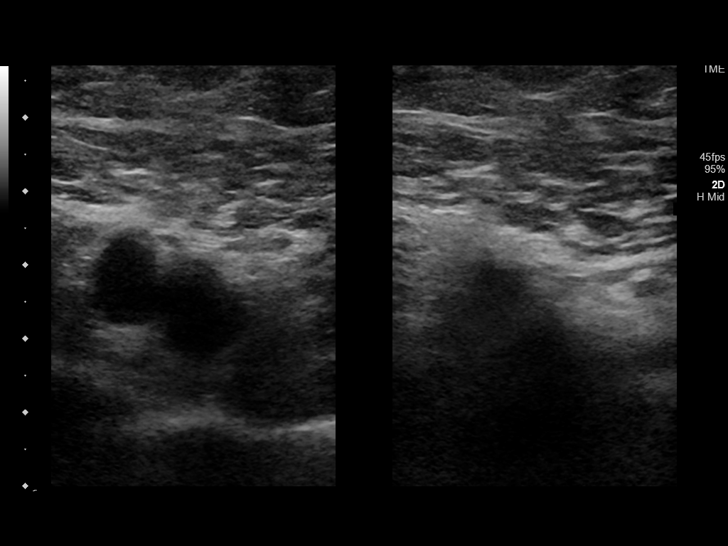
[im 34/60]
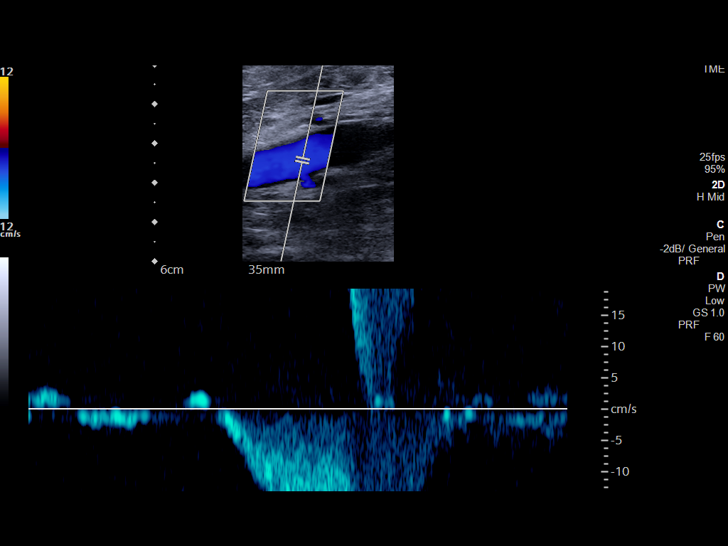
[im 39/60]
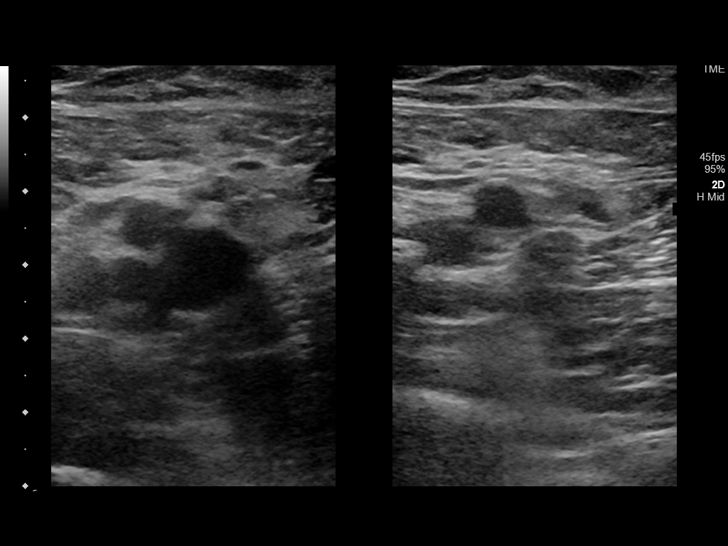
[im 44/60]
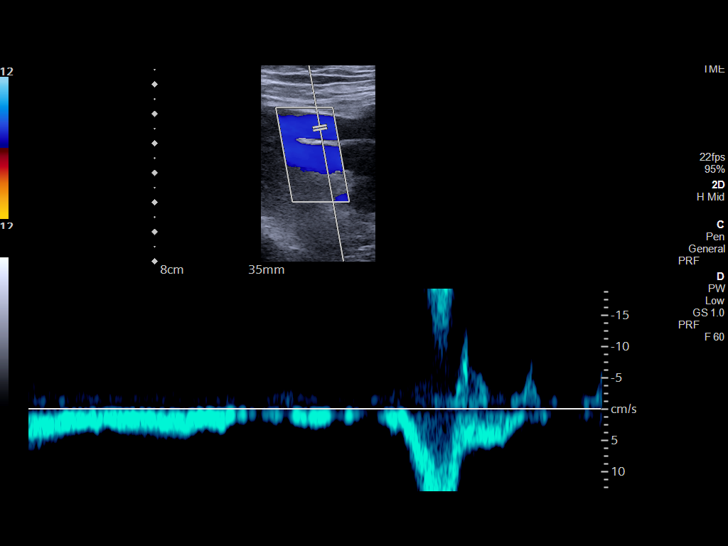
[im 49/60]
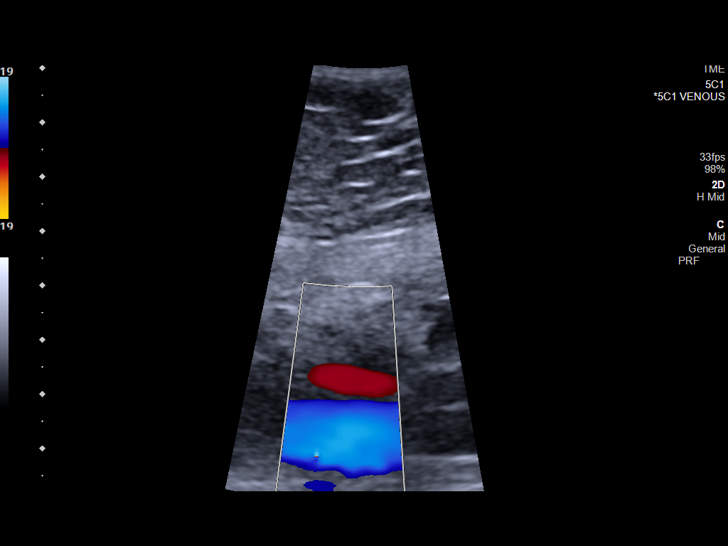
[im 54/60]
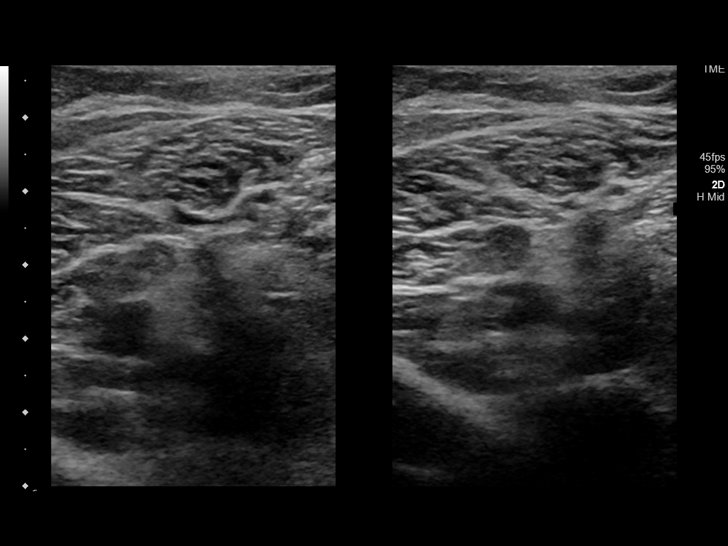
[im 60/60]
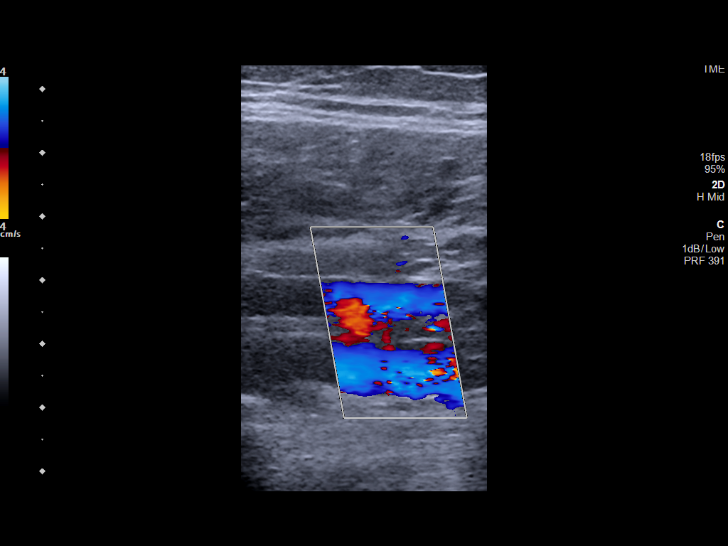

[13 of 24 positions shown; findings below may reference images not displayed]

FINDINGS: RIGHT LOWER EXTREMITY

Common Femoral Vein: No evidence of thrombus. Normal
compressibility, respiratory phasicity and response to augmentation.

Saphenofemoral Junction: No evidence of thrombus. Normal
compressibility and flow on color Doppler imaging.

Profunda Femoral Vein: No evidence of thrombus. Normal
compressibility and flow on color Doppler imaging.

Femoral Vein: No evidence of thrombus. Normal compressibility,
respiratory phasicity and response to augmentation.

Popliteal Vein: No evidence of thrombus. Normal compressibility,
respiratory phasicity and response to augmentation.

Calf Veins: No evidence of thrombus. Normal compressibility and flow
on color Doppler imaging.

Superficial Great Saphenous Vein: No evidence of thrombus. Normal
compressibility.

Venous Reflux:  None.

Other Findings:  None.

LEFT LOWER EXTREMITY

Common Femoral Vein: No evidence of thrombus. Normal
compressibility, respiratory phasicity and response to augmentation.

Saphenofemoral Junction: No evidence of thrombus. Normal
compressibility and flow on color Doppler imaging.

Profunda Femoral Vein: No evidence of thrombus. Normal
compressibility and flow on color Doppler imaging.

Femoral Vein: No evidence of thrombus. Normal compressibility,
respiratory phasicity and response to augmentation.

Popliteal Vein: No evidence of thrombus. Normal compressibility,
respiratory phasicity and response to augmentation.

Calf Veins: No evidence of thrombus. Normal compressibility and flow
on color Doppler imaging.

Superficial Great Saphenous Vein: No evidence of thrombus. Normal
compressibility.

Venous Reflux:  None.

Other Findings:  None.
IMPRESSION: No evidence of deep venous thrombosis in either lower extremity.

## 2021-05-09 ENCOUNTER — Encounter: Payer: BC Managed Care – PPO | Admitting: Podiatry

## 2021-05-23 ENCOUNTER — Ambulatory Visit (INDEPENDENT_AMBULATORY_CARE_PROVIDER_SITE_OTHER): Payer: BC Managed Care – PPO | Admitting: Podiatry

## 2021-05-23 ENCOUNTER — Encounter: Payer: Self-pay | Admitting: Podiatry

## 2021-05-23 DIAGNOSIS — L603 Nail dystrophy: Secondary | ICD-10-CM | POA: Diagnosis not present

## 2021-05-23 MED ORDER — TERBINAFINE HCL 250 MG PO TABS: 250.0000 mg | ORAL_TABLET | Freq: Every day | ORAL | 0 refills | Status: DC

## 2021-05-23 NOTE — Progress Notes (Signed)
She presents today for follow-up of her nail fungus she has completed 120 days plus another 30 days of the medication.  She states that I was not taking it regularly so I am not sure that that is working as well. ? ?Objective: Vital signs are stable she alert and oriented x3.  It appears that she has grown out considerably and lightened considerably though she still has some nail dystrophy that is present. ? ?Assessment: Onychomycosis is resolving nail dystrophy is still present. ? ?Plan: I am going to refill her Lamisil for another 30 days.  I will follow-up with her in 3 months. ?

## 2021-08-22 ENCOUNTER — Encounter: Payer: BC Managed Care – PPO | Admitting: Podiatry

## 2021-08-29 ENCOUNTER — Encounter: Payer: BC Managed Care – PPO | Admitting: Podiatry

## 2021-09-17 ENCOUNTER — Encounter: Payer: Self-pay | Admitting: Podiatry

## 2021-09-17 ENCOUNTER — Ambulatory Visit: Payer: BC Managed Care – PPO | Admitting: Podiatry

## 2021-09-17 DIAGNOSIS — L603 Nail dystrophy: Secondary | ICD-10-CM | POA: Diagnosis not present

## 2021-09-17 DIAGNOSIS — M79676 Pain in unspecified toe(s): Secondary | ICD-10-CM

## 2021-09-17 DIAGNOSIS — B351 Tinea unguium: Secondary | ICD-10-CM | POA: Diagnosis not present

## 2021-09-17 MED ORDER — TERBINAFINE HCL 250 MG PO TABS: 250.0000 mg | ORAL_TABLET | Freq: Every day | ORAL | 0 refills | Status: DC

## 2021-09-17 NOTE — Progress Notes (Signed)
She presents today after 150 days of Lamisil therapy.  She states that seems to be doing pretty good I really cannot tell that there is that much difference has not had any problems taking the medicine denies fever chills nausea vomiting muscle aches pains calf pain back pain chest pain shortness of breath.  Objective: Vital signs are stable alert and oriented x3 toenails are thick and have polish on them.  I remove the polish today debrided the nails down they appear to be much clearer than they were previously and I do think that she agrees with me on that.  Assessment: Long-term therapy with Lamisil.  Plan: Discussed etiology pathology conservative versus surgical therapies at this point I debrided the nails for her today 1 through 5 bilaterally and start her back on Lamisil 30 tablets 1 every other day for the next 60 days.  Follow-up with her in 3 months.

## 2021-12-12 ENCOUNTER — Encounter: Payer: Self-pay | Admitting: Podiatry

## 2021-12-12 ENCOUNTER — Ambulatory Visit: Payer: BC Managed Care – PPO | Admitting: Podiatry

## 2021-12-12 DIAGNOSIS — L603 Nail dystrophy: Secondary | ICD-10-CM

## 2021-12-12 MED ORDER — TERBINAFINE HCL 250 MG PO TABS: 250.0000 mg | ORAL_TABLET | Freq: Every day | ORAL | 0 refills | Status: DC

## 2021-12-12 NOTE — Progress Notes (Signed)
She presents today for follow-up of her nail fungus.  She is very happy with the outcome states that is starting to get better she has completed her third every other day dosing.  She states that she is feeling better about it.  Denies any problems taking the medication.  Objective: Vital signs are stable she is alert and oriented x 3.  There is no erythema pills drainage or odor nails are grown out by approximately 50 to 60% with the exception of the hallux right which does demonstrate a dystrophy.  Assessment: Long-term therapy onychomycosis.  Plan: Another terbinafine every other day dose for the next 60 days I will then follow-up with her in 3 months

## 2022-03-20 ENCOUNTER — Ambulatory Visit: Payer: BC Managed Care – PPO | Admitting: Podiatry

## 2022-04-29 ENCOUNTER — Ambulatory Visit: Payer: BC Managed Care – PPO | Admitting: Podiatry

## 2022-05-27 ENCOUNTER — Ambulatory Visit: Payer: BC Managed Care – PPO | Admitting: Podiatry

## 2022-05-27 DIAGNOSIS — L603 Nail dystrophy: Secondary | ICD-10-CM | POA: Diagnosis not present

## 2022-05-27 MED ORDER — TERBINAFINE HCL 250 MG PO TABS: 250.0000 mg | ORAL_TABLET | Freq: Every day | ORAL | 0 refills | Status: DC

## 2022-05-28 NOTE — Progress Notes (Signed)
She presents today states that she has gel nails but she is completed 150 tablets of Lamisil consecutively and 2 every other day doses.  She states that is looking a lot better and really happy with it thus far denies any fever chills nausea vomit muscle aches pains calf pain back pain chest pain shortness of breath.  She has no problem taking the medication.  Objective: Vital signs stable alert oriented x 3.  Toenails are improving by greater than 60 to 70% at this point I think it is imperative we continue with therapy or we risk regression.  Going to start her on another 30 tablets of Lamisil 1 tablet every other day and I will follow-up with her in 3 months.

## 2022-07-18 ENCOUNTER — Other Ambulatory Visit: Payer: Self-pay | Admitting: Internal Medicine

## 2022-07-18 DIAGNOSIS — R0609 Other forms of dyspnea: Secondary | ICD-10-CM

## 2022-08-09 ENCOUNTER — Ambulatory Visit (INDEPENDENT_AMBULATORY_CARE_PROVIDER_SITE_OTHER): Payer: BC Managed Care – PPO

## 2022-08-09 DIAGNOSIS — R0609 Other forms of dyspnea: Secondary | ICD-10-CM

## 2022-08-09 MED ORDER — TECHNETIUM TC 99M SESTAMIBI GENERIC - CARDIOLITE
10.8000 | Freq: Once | INTRAVENOUS | Status: AC | PRN
  Administered 2022-08-09: 10.8 via INTRAVENOUS

## 2022-08-09 MED ORDER — TECHNETIUM TC 99M SESTAMIBI GENERIC - CARDIOLITE
31.9000 | Freq: Once | INTRAVENOUS | Status: AC | PRN
  Administered 2022-08-09: 31.9 via INTRAVENOUS

## 2022-08-19 ENCOUNTER — Encounter: Payer: Self-pay | Admitting: Podiatry

## 2022-08-19 ENCOUNTER — Ambulatory Visit: Payer: BC Managed Care – PPO | Admitting: Podiatry

## 2022-08-19 DIAGNOSIS — L603 Nail dystrophy: Secondary | ICD-10-CM

## 2022-08-19 MED ORDER — TERBINAFINE HCL 250 MG PO TABS: 250.0000 mg | ORAL_TABLET | Freq: Every day | ORAL | 0 refills | Status: DC

## 2022-08-19 NOTE — Progress Notes (Signed)
She presents today for follow-up of her nail fungus she is complete her fifth every other day dose states that they are getting their of the looking much better she is very happy with the outcome thus far.  States that she is just saw Dr. Bluford Main who had performed liver enzyme studies and states that everything is doing fine.  Objective: Vital signs stable alert oriented x 3.  There is no erythema Dem salines drainage odor she has mycotic nails which are being treated with Lamisil that appear to be growing out very slowly.  Assessment: Well-healing onychomycosis slow to grow with the long-term use of Lamisil.  Plan: At this point were going to do her sixth every other day dose and will follow-up with her in 3 months I encouraged her to try to stay on task taking the medication.

## 2022-11-20 ENCOUNTER — Ambulatory Visit: Payer: BC Managed Care – PPO | Admitting: Podiatry

## 2022-11-25 ENCOUNTER — Ambulatory Visit
Admission: EM | Admit: 2022-11-25 | Discharge: 2022-11-25 | Disposition: A | Discharge status: Home / Self Care | Payer: BC Managed Care – PPO

## 2022-11-25 DIAGNOSIS — R040 Epistaxis: Secondary | ICD-10-CM | POA: Diagnosis not present

## 2022-11-25 NOTE — ED Triage Notes (Signed)
Pt presents to UC for epistaxis ongoing for >15 min. States was checking out at [employer], went to bend over & bleeding started.

## 2022-11-25 NOTE — Discharge Instructions (Signed)
-  If it bleeds again pinch nose and if it does not stop after 15 min try Afrin -Do not pick nose of blow nose -If severe headache, dizziness, vomiting, bleeding from other sites go to ER

## 2022-11-25 NOTE — ED Provider Notes (Signed)
[facility]    CSN: [id number] Arrival date & time: 11/25/22  1653      History   Chief Complaint Chief Complaint  Patient presents with   Epistaxis    HPI Sara Buchanan is a 62 y.o. female with history of diabetes, hyperlipidemia, hypothyroidism.  She presents with her husband today for bleeding from the left nostril for over 15 minutes.  She was able to get the bleeding is stopped in the exam room.  She says that she was just checking out at [facility] and when she bent over she noticed her nose was running.  She reports wiping her nose and noticing it was bleeding.  Patient says she has not had a nosebleed since she was a child.  She denies picking at her nose, nasal congestion or nasal dryness.  Has felt well.  Reports slight headache but denies any vision changes, nausea/vomiting, dizziness or weakness.  No bleeding from other sites.  Denies any anticoagulant use.  No injury to nose.  No other complaints.  HPI  Past Medical History:  Diagnosis Date   Diabetes (HCC)    High cholesterol    Hypothyroidism    Thyroid condition     Patient Active Problem List   Diagnosis Date Noted   Colon cancer screening     Past Surgical History:  Procedure Laterality Date   COLONOSCOPY WITH PROPOFOL N/A 01/05/2021   Procedure: COLONOSCOPY WITH PROPOFOL;  Surgeon: Midge Minium, MD;  Location: [facility];  Service: Endoscopy;  Laterality: N/A;  Diabetic   FOOT SURGERY Bilateral     OB History     Gravida  0   Para  0   Term  0   Preterm  0   AB  0   Living  0      SAB  0   IAB  0   Ectopic  0   Multiple  0   Live Births  0            Home Medications    Prior to Admission medications   Medication Sig Start Date End Date Taking? Authorizing Provider  atorvastatin (LIPITOR) 80 MG tablet Take 80 mg by mouth daily. 12/22/20  Yes [provider]  carvedilol (COREG) 6.25 MG tablet Take 6.25 mg by mouth daily.   Yes [provider]  Cholecalciferol (VITAMIN D3) 1.25 MG (50000 UT) CAPS Take 1 capsule by mouth once a week. 01/27/20  Yes [provider]  CLENPIQ 10-3.5-12 MG-GM -GM/160ML SOLN SMARTSIG:1 Kit(s) By Mouth Once 12/25/20  Yes [provider]  Continuous Glucose Receiver (DEXCOM G7 RECEIVER) DEVI USE WITH DEXCOM SENSOR TO MONITOR BLOOD SUGAR 06/07/22  Yes [provider]  Continuous Glucose Sensor (DEXCOM G7 SENSOR) MISC USE WITH DEXCOM RECEIVER FOR MONITORING BLOOD SUGAR. CHANGE SENSOR EVERY 10 DAYS. 06/06/22  Yes [provider]  Dapagliflozin Propanediol (FARXIGA PO) Take by mouth daily.   Yes [provider]  insulin degludec (TRESIBA FLEXTOUCH) 100 UNIT/ML FlexTouch Pen Inject 50 Units into the skin daily.   Yes [provider]  levothyroxine (SYNTHROID, LEVOTHROID) 75 MCG tablet Take 75 mcg by mouth daily before breakfast.   Yes [provider]  lisinopril (PRINIVIL,ZESTRIL) 10 MG tablet Take 10 mg by mouth daily.   Yes [provider]  NOVOLOG FLEXPEN 100 UNIT/ML FlexPen Inject into the skin. Sliding scale with meals 03/14/20  Yes [provider]  [facility] VERIO test strip 1 each 2 (two)  times daily. 04/18/22  Yes [provider]  Semaglutide (RYBELSUS PO) Take by mouth daily.   Yes [provider]  terbinafine (LAMISIL) 250 MG tablet Take 1 tablet (250 mg total) by mouth daily. 08/19/22  Yes Hyatt, Max T, DPM  Multiple Vitamin (MULTIVITAMIN) tablet Take 1 tablet by mouth daily. Patient not taking: Reported on 05/30/2020    [provider]  NOVOFINE 32G X 6 MM MISC  08/01/14   [provider]    Family History Family History  Problem Relation Age of Onset   Diabetes Mother    Hypertension Mother    Hypertension Father    Cancer Father    Breast cancer Brother     Social History Social History   Tobacco Use   Smoking status: Never   Smokeless tobacco: Never  Vaping Use    Vaping status: Never Used  Substance Use Topics   Alcohol use: No    Comment: rare   Drug use: Never     Allergies   Metformin and related   Review of Systems Review of Systems  Constitutional:  Negative for fatigue and fever.  HENT:  Positive for nosebleeds. Negative for congestion and rhinorrhea.   Neurological:  Positive for headaches. Negative for dizziness and weakness.     Physical Exam Triage Vital Signs ED Triage Vitals  Encounter Vitals Group     BP 11/25/22 1724 139/80     Systolic BP Percentile --      Diastolic BP Percentile --      Pulse Rate 11/25/22 1724 75     Resp 11/25/22 1724 16     Temp 11/25/22 1724 98.2 F (36.8 C)     Temp Source 11/25/22 1724 Oral     SpO2 11/25/22 1724 100 %     Weight 11/25/22 1723 245 lb (111.1 kg)     Height 11/25/22 1723 5\' 4"  (1.626 m)     Head Circumference --      Peak Flow --      Pain Score 11/25/22 1727 0     Pain Loc --      Pain Education --      Exclude from Growth Chart --    No data found.  Updated Vital Signs BP 139/80 (BP Location: Left Arm)   Pulse 75   Temp 98.2 F (36.8 C) (Oral)   Resp 16   Ht 5\' 4"  (1.626 m)   Wt 245 lb (111.1 kg)   SpO2 100%   BMI 42.05 kg/m   Physical Exam Vitals and nursing note reviewed.  Constitutional:      General: She is not in acute distress.    Appearance: Normal appearance. She is not ill-appearing or toxic-appearing.  HENT:     Head: Normocephalic and atraumatic.     Nose:     Left Nostril: Epistaxis present.     Mouth/Throat:     Mouth: Mucous membranes are moist.     Pharynx: Oropharynx is clear.  Eyes:     General: No scleral icterus.       Right eye: No discharge.        Left eye: No discharge.     Conjunctiva/sclera: Conjunctivae normal.  Cardiovascular:     Rate and Rhythm: Normal rate and regular rhythm.     Heart sounds: Normal heart sounds.  Pulmonary:     Effort: Pulmonary effort is normal. No respiratory distress.     Breath sounds:  Normal breath sounds.  Musculoskeletal:     Cervical back: Neck supple.  Skin:    General: Skin is dry.  Neurological:     General: No focal deficit present.     Mental Status: She is alert. Mental status is at baseline.     Motor: No weakness.     Gait: Gait normal.  Psychiatric:        Mood and Affect: Mood normal.        Behavior: Behavior normal.      UC Treatments / Results  Labs (all labs ordered are listed, but only abnormal results are displayed) Labs Reviewed - No data to display  EKG   Radiology No results found.  Procedures Procedures (including critical care time)  Medications Ordered in UC Medications - No data to display  Initial Impression / Assessment and Plan / UC Course  I have reviewed the triage vital signs and the nursing notes.  Pertinent labs & imaging results that were available during my care of the patient were reviewed by me and considered in my medical decision making (see chart for details).   62 year old female presents for bleeding from the left nostril.  Nosebleed began at [facility] while she was waiting in line.  She has held pressure and was able to get the bleeding to stop in about 15 minutes.  However she is worried about rebleeding.  No history of anticoagulant use.  No history of nosebleeds since she was a child.  Slight headache at this time.  On exam is bleeding from the left side.  There is small area of epistaxis on the septum.  The area started to rebleed when I was examining her.  Discussed cauterization which she is agreeable to.  Area cleaned with saline and alcohol.  Applied lidocaine to a cotton swab and then to the inside of the nose.  Then I used silver nitrate to cauterize.  Patient tolerated this well.  Observed her for the next 15 minutes or so and the area did not rebleed.  Discussed management of nosebleeds with her.  Advised of ED precautions.   Final Clinical Impressions(s) / UC Diagnoses   Final diagnoses:   Left-sided epistaxis     Discharge Instructions      -If it bleeds again pinch nose and if it does not stop after 15 min try Afrin -Do not pick nose of blow nose -If severe headache, dizziness, vomiting, bleeding from other sites go to ER   ED Prescriptions   None    PDMP not reviewed this encounter.   Shirlee Latch, PA-C 11/25/22 1954

## 2023-10-03 ENCOUNTER — Ambulatory Visit: Admitting: Cardiology

## 2023-10-03 ENCOUNTER — Encounter: Payer: Self-pay | Admitting: Cardiology

## 2023-10-03 VITALS — BP 128/76 | HR 83 | Ht 64.0 in | Wt 257.2 lb

## 2023-10-03 DIAGNOSIS — Z0130 Encounter for examination of blood pressure without abnormal findings: Secondary | ICD-10-CM

## 2023-10-03 DIAGNOSIS — E559 Vitamin D deficiency, unspecified: Secondary | ICD-10-CM | POA: Diagnosis not present

## 2023-10-03 DIAGNOSIS — E039 Hypothyroidism, unspecified: Secondary | ICD-10-CM

## 2023-10-03 DIAGNOSIS — Z7689 Persons encountering health services in other specified circumstances: Secondary | ICD-10-CM | POA: Diagnosis not present

## 2023-10-03 DIAGNOSIS — E119 Type 2 diabetes mellitus without complications: Secondary | ICD-10-CM | POA: Diagnosis not present

## 2023-10-03 DIAGNOSIS — E782 Mixed hyperlipidemia: Secondary | ICD-10-CM | POA: Insufficient documentation

## 2023-10-03 MED ORDER — VITAMIN D3 1.25 MG (50000 UT) PO CAPS: 1.0000 | ORAL_CAPSULE | ORAL | 4 refills | Status: DC

## 2023-10-03 MED ORDER — DAPAGLIFLOZIN PROPANEDIOL 10 MG PO TABS: 10.0000 mg | ORAL_TABLET | Freq: Every day | ORAL | 1 refills | Status: DC

## 2023-10-03 MED ORDER — LISINOPRIL 10 MG PO TABS: 10.0000 mg | ORAL_TABLET | Freq: Every day | ORAL | 1 refills | Status: DC

## 2023-10-03 NOTE — Progress Notes (Signed)
 New Patient Office Visit  Subjective   Patient ID: Sara Buchanan, female    DOB: [birth year]  Age: 63 y.o. MRN: [id number]  CC:  Chief Complaint  Patient presents with   Establish Care    NPE jadali pt.    HPI Sara Buchanan presents to establish care Previous Primary Care provider/office:   she does not have additional concerns to discuss today.   Patient in office to establish care. Patient stopped some of her medications due to running out. Will get fasting lab work then discuss medications to restart.  Mammogram [facility] 04/07/2023 Pap smear 03/2023 negative    Outpatient Encounter Medications as of 10/03/2023  Medication Sig   atorvastatin (LIPITOR) 80 MG tablet Take 80 mg by mouth daily.   Continuous Glucose Receiver (DEXCOM G7 RECEIVER) DEVI USE WITH DEXCOM SENSOR TO MONITOR BLOOD SUGAR   Continuous Glucose Sensor (DEXCOM G7 SENSOR) MISC USE WITH DEXCOM RECEIVER FOR MONITORING BLOOD SUGAR. CHANGE SENSOR EVERY 10 DAYS.   insulin degludec (TRESIBA FLEXTOUCH) 100 UNIT/ML FlexTouch Pen Inject 50 Units into the skin daily.   NOVOLOG FLEXPEN 100 UNIT/ML FlexPen Inject into the skin. Sliding scale with meals   ONETOUCH VERIO test strip 1 each 2 (two) times daily.   [DISCONTINUED] lisinopril  (PRINIVIL ,ZESTRIL ) 10 MG tablet Take 10 mg by mouth daily.   Cholecalciferol (VITAMIN D3) 1.25 MG (50000 UT) CAPS Take 1 capsule (1.25 mg total) by mouth once a week.   dapagliflozin  propanediol (FARXIGA ) 10 MG TABS tablet Take 1 tablet (10 mg total) by mouth daily.   levothyroxine (SYNTHROID, LEVOTHROID) 75 MCG tablet Take 75 mcg by mouth daily before breakfast. (Patient not taking: Reported on 10/03/2023)   lisinopril  (ZESTRIL ) 10 MG tablet Take 1 tablet (10 mg total) by mouth daily.   Semaglutide (RYBELSUS PO) Take by mouth daily. (Patient not taking: Reported on 10/03/2023)   terbinafine  (LAMISIL ) 250 MG tablet Take 1 tablet (250 mg total) by mouth daily. (Patient not taking: Reported  on 10/03/2023)   [DISCONTINUED] carvedilol (COREG) 6.25 MG tablet Take 6.25 mg by mouth daily. (Patient not taking: Reported on 10/03/2023)   [DISCONTINUED] Cholecalciferol (VITAMIN D3) 1.25 MG (50000 UT) CAPS Take 1 capsule by mouth once a week. (Patient not taking: Reported on 10/03/2023)   [DISCONTINUED] CLENPIQ  10-3.5-12 MG-GM -GM/160ML SOLN SMARTSIG:1 Kit(s) By Mouth Once (Patient not taking: Reported on 10/03/2023)   [DISCONTINUED] Dapagliflozin  Propanediol (FARXIGA  PO) Take by mouth daily. (Patient not taking: Reported on 10/03/2023)   [DISCONTINUED] Multiple Vitamin (MULTIVITAMIN) tablet Take 1 tablet by mouth daily. (Patient not taking: Reported on 10/03/2023)   [DISCONTINUED] NOVOFINE 32G X 6 MM MISC  (Patient not taking: Reported on 10/03/2023)   No facility-administered encounter medications on file as of 10/03/2023.    Past Medical History:  Diagnosis Date   Diabetes (HCC)    High cholesterol    Hypothyroidism    Thyroid condition     Past Surgical History:  Procedure Laterality Date   COLONOSCOPY WITH PROPOFOL  N/A 01/05/2021   Procedure: COLONOSCOPY WITH PROPOFOL ;  Surgeon: Jinny Carmine, MD;  Location: [facility];  Service: Endoscopy;  Laterality: N/A;  Diabetic   FOOT SURGERY Bilateral     Family History  Problem Relation Age of Onset   Diabetes Mother    Hypertension Mother    Hypertension Father    Cancer Father    Breast cancer Brother     Social History   Socioeconomic History   Marital status: Married  Spouse name: Not on file   Number of children: Not on file   Years of education: Not on file   Highest education level: Not on file  Occupational History   Not on file  Tobacco Use   Smoking status: Never   Smokeless tobacco: Never  Vaping Use   Vaping status: Never Used  Substance and Sexual Activity   Alcohol use: No    Comment: rare   Drug use: Never   Sexual activity: Not Currently    Birth control/protection: None, Post-menopausal   Other Topics Concern   Not on file  Social History Narrative   Not on file   Social Drivers of Health   Financial Resource Strain: Not on file  Food Insecurity: Not on file  Transportation Needs: Not on file  Physical Activity: Not on file  Stress: Not on file  Social Connections: Not on file  Intimate Partner Violence: Not on file    Review of Systems  Constitutional: Negative.   HENT: Negative.    Eyes: Negative.   Respiratory: Negative.  Negative for shortness of breath.   Cardiovascular: Negative.  Negative for chest pain.  Gastrointestinal: Negative.  Negative for abdominal pain, constipation and diarrhea.  Genitourinary: Negative.   Musculoskeletal:  Negative for joint pain and myalgias.  Skin: Negative.   Neurological: Negative.  Negative for dizziness and headaches.  Endo/Heme/Allergies: Negative.   All other systems reviewed and are negative.       Objective   BP 128/76   Pulse 83   Ht 5' 4 (1.626 m)   Wt 257 lb 3.2 oz (116.7 kg)   SpO2 98%   BMI 44.15 kg/m   Physical Exam Vitals and nursing note reviewed.  Constitutional:      Appearance: Normal appearance. She is normal weight.  HENT:     Head: Normocephalic and atraumatic.     Nose: Nose normal.     Mouth/Throat:     Mouth: Mucous membranes are moist.  Eyes:     Extraocular Movements: Extraocular movements intact.     Conjunctiva/sclera: Conjunctivae normal.     Pupils: Pupils are equal, round, and reactive to light.  Cardiovascular:     Rate and Rhythm: Normal rate and regular rhythm.     Pulses: Normal pulses.     Heart sounds: Normal heart sounds.  Pulmonary:     Effort: Pulmonary effort is normal.     Breath sounds: Normal breath sounds.  Abdominal:     General: Abdomen is flat. Bowel sounds are normal.     Palpations: Abdomen is soft.  Musculoskeletal:        General: Normal range of motion.     Cervical back: Normal range of motion.  Skin:    General: Skin is warm and dry.   Neurological:     General: No focal deficit present.     Mental Status: She is alert and oriented to person, place, and time.  Psychiatric:        Mood and Affect: Mood normal.        Behavior: Behavior normal.        Thought Content: Thought content normal.        Judgment: Judgment normal.        Assessment & Plan:  Return for fasting lab work  Problem List Items Addressed This Visit       Endocrine   Diabetes mellitus without complication (HCC)   Relevant Medications   dapagliflozin  propanediol (FARXIGA ) 10  MG TABS tablet   lisinopril  (ZESTRIL ) 10 MG tablet   Other Relevant Orders   CMP14+EGFR   Hemoglobin A1c   Hypothyroidism   Relevant Orders   TSH     Other   Mixed hyperlipidemia - Primary   Relevant Medications   lisinopril  (ZESTRIL ) 10 MG tablet   Other Relevant Orders   Lipid Profile   Vitamin D deficiency   Relevant Orders   Vitamin D (25 hydroxy)   Encounter to establish care    Return in about 2 weeks (around 10/17/2023).   Total time spent: 25 minutes  Google, NP  10/03/2023   This document may have been prepared by Dragon Voice Recognition software and as such may include unintentional dictation errors.

## 2023-10-15 ENCOUNTER — Other Ambulatory Visit

## 2023-10-15 ENCOUNTER — Other Ambulatory Visit: Payer: Self-pay

## 2023-10-15 DIAGNOSIS — E782 Mixed hyperlipidemia: Secondary | ICD-10-CM

## 2023-10-15 DIAGNOSIS — E559 Vitamin D deficiency, unspecified: Secondary | ICD-10-CM

## 2023-10-15 DIAGNOSIS — E039 Hypothyroidism, unspecified: Secondary | ICD-10-CM

## 2023-10-15 DIAGNOSIS — E119 Type 2 diabetes mellitus without complications: Secondary | ICD-10-CM

## 2023-10-16 ENCOUNTER — Ambulatory Visit: Payer: Self-pay | Admitting: Cardiology

## 2023-10-16 LAB — HEMOGLOBIN A1C
Est. average glucose Bld gHb Est-mCnc: 229 mg/dL
Hgb A1c MFr Bld: 9.6 % — ABNORMAL HIGH (ref 4.8–5.6)

## 2023-10-16 LAB — CMP14+EGFR
ALT: 14 IU/L (ref 0–32)
AST: 17 IU/L (ref 0–40)
Albumin: 4 g/dL (ref 3.9–4.9)
Alkaline Phosphatase: 111 IU/L (ref 49–135)
BUN/Creatinine Ratio: 18 (ref 12–28)
BUN: 14 mg/dL (ref 8–27)
Bilirubin Total: 0.7 mg/dL (ref 0.0–1.2)
CO2: 24 mmol/L (ref 20–29)
Calcium: 9.4 mg/dL (ref 8.7–10.3)
Chloride: 98 mmol/L (ref 96–106)
Creatinine, Ser: 0.77 mg/dL (ref 0.57–1.00)
Globulin, Total: 2.7 g/dL (ref 1.5–4.5)
Glucose: 149 mg/dL — ABNORMAL HIGH (ref 70–99)
Potassium: 4.5 mmol/L (ref 3.5–5.2)
Sodium: 138 mmol/L (ref 134–144)
Total Protein: 6.7 g/dL (ref 6.0–8.5)
eGFR: 87 mL/min/1.73 (ref >59)

## 2023-10-16 LAB — LIPID PANEL
Chol/HDL Ratio: 3.6 ratio (ref 0.0–4.4)
Cholesterol, Total: 206 mg/dL — ABNORMAL HIGH (ref 100–199)
HDL: 57 mg/dL (ref >39)
LDL Chol Calc (NIH): 134 mg/dL — ABNORMAL HIGH (ref 0–99)
Triglycerides: 84 mg/dL (ref 0–149)
VLDL Cholesterol Cal: 15 mg/dL (ref 5–40)

## 2023-10-16 LAB — TSH: TSH: 1.08 u[IU]/mL (ref 0.450–4.500)

## 2023-10-16 LAB — VITAMIN D 25 HYDROXY (VIT D DEFICIENCY, FRACTURES): Vit D, 25-Hydroxy: 23.5 ng/mL — ABNORMAL LOW (ref 30.0–100.0)

## 2023-10-17 ENCOUNTER — Ambulatory Visit: Admitting: Cardiology

## 2023-10-21 ENCOUNTER — Encounter: Payer: Self-pay | Admitting: Cardiology

## 2023-10-21 ENCOUNTER — Ambulatory Visit (INDEPENDENT_AMBULATORY_CARE_PROVIDER_SITE_OTHER): Admitting: Cardiology

## 2023-10-21 VITALS — BP 136/82 | HR 88 | Ht 64.0 in | Wt 260.2 lb

## 2023-10-21 DIAGNOSIS — E782 Mixed hyperlipidemia: Secondary | ICD-10-CM | POA: Diagnosis not present

## 2023-10-21 DIAGNOSIS — E1165 Type 2 diabetes mellitus with hyperglycemia: Secondary | ICD-10-CM | POA: Diagnosis not present

## 2023-10-21 DIAGNOSIS — E559 Vitamin D deficiency, unspecified: Secondary | ICD-10-CM | POA: Diagnosis not present

## 2023-10-21 DIAGNOSIS — E039 Hypothyroidism, unspecified: Secondary | ICD-10-CM

## 2023-10-21 DIAGNOSIS — E119 Type 2 diabetes mellitus without complications: Secondary | ICD-10-CM

## 2023-10-21 DIAGNOSIS — Z0130 Encounter for examination of blood pressure without abnormal findings: Secondary | ICD-10-CM

## 2023-10-21 MED ORDER — ATORVASTATIN CALCIUM 80 MG PO TABS: 80.0000 mg | ORAL_TABLET | Freq: Every day | ORAL | 1 refills | Status: AC

## 2023-10-21 MED ORDER — NOVOLOG FLEXPEN 100 UNIT/ML ~~LOC~~ SOPN: 12.0000 [IU] | PEN_INJECTOR | Freq: Three times a day (TID) | SUBCUTANEOUS | 9 refills | Status: AC

## 2023-10-21 MED ORDER — DEXCOM G7 RECEIVER DEVI: 5 refills | Status: DC

## 2023-10-21 MED ORDER — TRESIBA FLEXTOUCH 100 UNIT/ML ~~LOC~~ SOPN: 50.0000 [IU] | PEN_INJECTOR | Freq: Every day | SUBCUTANEOUS | 3 refills | Status: AC

## 2023-10-21 MED ORDER — LISINOPRIL 10 MG PO TABS: 10.0000 mg | ORAL_TABLET | Freq: Every day | ORAL | 1 refills | Status: AC

## 2023-10-21 MED ORDER — VITAMIN D3 1.25 MG (50000 UT) PO CAPS: 1.0000 | ORAL_CAPSULE | ORAL | 4 refills | Status: AC

## 2023-10-21 MED ORDER — ONETOUCH VERIO VI STRP: 1.0000 | ORAL_STRIP | Freq: Two times a day (BID) | 3 refills | Status: AC

## 2023-10-21 MED ORDER — MOUNJARO 2.5 MG/0.5ML ~~LOC~~ SOAJ: 2.5000 mg | SUBCUTANEOUS | 3 refills | Status: DC

## 2023-10-21 NOTE — Progress Notes (Signed)
 Established Patient Office Visit  Subjective:  Patient ID: Sara Buchanan, female    DOB: [birth year]  Age: 63 y.o. MRN: [id number]  Chief Complaint  Patient presents with   Follow-up    2 week lab results    Patient in office for 2 week follow up, discuss recent lab results. Patient doing well, no complaints today.  Discussed recent lab results. Vitamin D low, recommend weekly vitamin D supplement. Hgb A1c elevated. Patient wanting to try Mounjaro, prescription sent. LDL elevated, restart atorvastatin. Continue all other medications.     No other concerns at this time.   Past Medical History:  Diagnosis Date   Diabetes (HCC)    High cholesterol    Hypothyroidism    Thyroid condition     Past Surgical History:  Procedure Laterality Date   COLONOSCOPY WITH PROPOFOL  N/A 01/05/2021   Procedure: COLONOSCOPY WITH PROPOFOL ;  Surgeon: Jinny Carmine, MD;  Location: [facility];  Service: Endoscopy;  Laterality: N/A;  Diabetic   FOOT SURGERY Bilateral     Social History   Socioeconomic History   Marital status: Married    Spouse name: Not on file   Number of children: Not on file   Years of education: Not on file   Highest education level: Not on file  Occupational History   Not on file  Tobacco Use   Smoking status: Never   Smokeless tobacco: Never  Vaping Use   Vaping status: Never Used  Substance and Sexual Activity   Alcohol use: No    Comment: rare   Drug use: Never   Sexual activity: Not Currently    Birth control/protection: None, Post-menopausal  Other Topics Concern   Not on file  Social History Narrative   Not on file   Social Drivers of Health   Financial Resource Strain: Not on file  Food Insecurity: Not on file  Transportation Needs: Not on file  Physical Activity: Not on file  Stress: Not on file  Social Connections: Not on file  Intimate Partner Violence: Not on file    Family History  Problem Relation Age of Onset    Diabetes Mother    Hypertension Mother    Hypertension Father    Cancer Father    Breast cancer Brother     Allergies  Allergen Reactions   Metformin And Related Diarrhea    Outpatient Medications Prior to Visit  Medication Sig   Continuous Glucose Sensor (DEXCOM G7 SENSOR) MISC USE WITH DEXCOM RECEIVER FOR MONITORING BLOOD SUGAR. CHANGE SENSOR EVERY 10 DAYS.   dapagliflozin  propanediol (FARXIGA ) 10 MG TABS tablet Take 1 tablet (10 mg total) by mouth daily.   [DISCONTINUED] atorvastatin (LIPITOR) 80 MG tablet Take 80 mg by mouth daily.   [DISCONTINUED] Cholecalciferol (VITAMIN D3) 1.25 MG (50000 UT) CAPS Take 1 capsule (1.25 mg total) by mouth once a week.   [DISCONTINUED] Continuous Glucose Receiver (DEXCOM G7 RECEIVER) DEVI USE WITH DEXCOM SENSOR TO MONITOR BLOOD SUGAR   [DISCONTINUED] insulin degludec (TRESIBA FLEXTOUCH) 100 UNIT/ML FlexTouch Pen Inject 50 Units into the skin daily.   [DISCONTINUED] lisinopril  (ZESTRIL ) 10 MG tablet Take 1 tablet (10 mg total) by mouth daily.   [DISCONTINUED] NOVOLOG FLEXPEN 100 UNIT/ML FlexPen Inject into the skin. Sliding scale with meals   [DISCONTINUED] ONETOUCH VERIO test strip 1 each 2 (two) times daily.   terbinafine  (LAMISIL ) 250 MG tablet Take 1 tablet (250 mg total) by mouth daily. (Patient not taking: Reported on 10/21/2023)   [DISCONTINUED]  levothyroxine (SYNTHROID, LEVOTHROID) 75 MCG tablet Take 75 mcg by mouth daily before breakfast. (Patient not taking: Reported on 10/21/2023)   [DISCONTINUED] Semaglutide (RYBELSUS PO) Take by mouth daily. (Patient not taking: Reported on 10/21/2023)   No facility-administered medications prior to visit.    Review of Systems  Constitutional: Negative.   HENT: Negative.    Eyes: Negative.   Respiratory: Negative.  Negative for shortness of breath.   Cardiovascular: Negative.  Negative for chest pain.  Gastrointestinal: Negative.  Negative for abdominal pain, constipation and diarrhea.   Genitourinary: Negative.   Musculoskeletal:  Negative for joint pain and myalgias.  Skin: Negative.   Neurological: Negative.  Negative for dizziness and headaches.  Endo/Heme/Allergies: Negative.   All other systems reviewed and are negative.      Objective:   BP 136/82   Pulse 88   Ht 5' 4 (1.626 m)   Wt 260 lb 3.2 oz (118 kg)   SpO2 97%   BMI 44.66 kg/m   Vitals:   10/21/23 1454  BP: 136/82  Pulse: 88  Height: 5' 4 (1.626 m)  Weight: 260 lb 3.2 oz (118 kg)  SpO2: 97%  BMI (Calculated): 44.64    Physical Exam Vitals and nursing note reviewed.  Constitutional:      Appearance: Normal appearance. She is normal weight.  HENT:     Head: Normocephalic and atraumatic.     Nose: Nose normal.     Mouth/Throat:     Mouth: Mucous membranes are moist.  Eyes:     Extraocular Movements: Extraocular movements intact.     Conjunctiva/sclera: Conjunctivae normal.     Pupils: Pupils are equal, round, and reactive to light.  Cardiovascular:     Rate and Rhythm: Normal rate and regular rhythm.     Pulses: Normal pulses.     Heart sounds: Normal heart sounds.  Pulmonary:     Effort: Pulmonary effort is normal.     Breath sounds: Normal breath sounds.  Abdominal:     General: Abdomen is flat. Bowel sounds are normal.     Palpations: Abdomen is soft.  Musculoskeletal:        General: Normal range of motion.     Cervical back: Normal range of motion.  Skin:    General: Skin is warm and dry.  Neurological:     General: No focal deficit present.     Mental Status: She is alert and oriented to person, place, and time.  Psychiatric:        Mood and Affect: Mood normal.        Behavior: Behavior normal.        Thought Content: Thought content normal.        Judgment: Judgment normal.      No results found for any visits on 10/21/23.  Recent Results (from the past 2160 hours)  Vitamin D (25 hydroxy)     Status: Abnormal   Collection Time: 10/15/23  8:48 AM  Result  Value Ref Range   Vit D, 25-Hydroxy 23.5 (L) 30.0 - 100.0 ng/mL    Comment: Vitamin D deficiency has been defined by the Institute of Medicine and an Endocrine Society practice guideline as a level of serum 25-OH vitamin D less than 20 ng/mL (1,2). The Endocrine Society went on to further define vitamin D insufficiency as a level between 21 and 29 ng/mL (2). 1. IOM (Institute of Medicine). 2010. Dietary reference    intakes for calcium and D. [place]: The  [employer]. 2. Holick MF, [place], Bischoff-Ferrari HA, et al.    Evaluation, treatment, and prevention of vitamin D    deficiency: an Endocrine Society clinical practice    guideline. JCEM. 2011 Jul; 96(7):1911-30.   Hemoglobin A1c     Status: Abnormal   Collection Time: 10/15/23  8:48 AM  Result Value Ref Range   Hgb A1c MFr Bld 9.6 (H) 4.8 - 5.6 %    Comment:          Prediabetes: 5.7 - 6.4          Diabetes: >6.4          Glycemic control for adults with diabetes: <7.0    Est. average glucose Bld gHb Est-mCnc 229 mg/dL  TSH     Status: None   Collection Time: 10/15/23  8:48 AM  Result Value Ref Range   TSH 1.080 0.450 - 4.500 uIU/mL  Lipid Profile     Status: Abnormal   Collection Time: 10/15/23  8:48 AM  Result Value Ref Range   Cholesterol, Total 206 (H) 100 - 199 mg/dL   Triglycerides 84 0 - 149 mg/dL   HDL 57 >60 mg/dL   VLDL Cholesterol Cal 15 5 - 40 mg/dL   LDL Chol Calc (NIH) 865 (H) 0 - 99 mg/dL   Chol/HDL Ratio 3.6 0.0 - 4.4 ratio    Comment:                                   T. Chol/HDL Ratio                                             Men  Women                               1/2 Avg.Risk  3.4    3.3                                   Avg.Risk  5.0    4.4                                2X Avg.Risk  9.6    7.1                                3X Avg.Risk 23.4   11.0   CMP14+EGFR     Status: Abnormal   Collection Time: 10/15/23  8:48 AM  Result Value Ref Range   Glucose 149 (H) 70 -  99 mg/dL   BUN 14 8 - 27 mg/dL   Creatinine, Ser 9.22 0.57 - 1.00 mg/dL   eGFR 87 >40 fO/fpw/8.26   BUN/Creatinine Ratio 18 12 - 28   Sodium 138 134 - 144 mmol/L   Potassium 4.5 3.5 - 5.2 mmol/L   Chloride 98 96 - 106 mmol/L   CO2 24 20 - 29 mmol/L   Calcium 9.4 8.7 - 10.3 mg/dL   Total Protein 6.7 6.0 - 8.5 g/dL   Albumin 4.0 3.9 - 4.9 g/dL  Globulin, Total 2.7 1.5 - 4.5 g/dL   Bilirubin Total 0.7 0.0 - 1.2 mg/dL   Alkaline Phosphatase 111 49 - 135 IU/L   AST 17 0 - 40 IU/L   ALT 14 0 - 32 IU/L      Assessment & Plan:  Restart medications. Mounjaro  Problem List Items Addressed This Visit       Endocrine   Diabetes mellitus without complication (HCC) - Primary   Relevant Medications   insulin degludec (TRESIBA FLEXTOUCH) 100 UNIT/ML FlexTouch Pen   atorvastatin (LIPITOR) 80 MG tablet   lisinopril  (ZESTRIL ) 10 MG tablet   NOVOLOG FLEXPEN 100 UNIT/ML FlexPen   tirzepatide (MOUNJARO) 2.5 MG/0.5ML Pen   Hypothyroidism     Other   Mixed hyperlipidemia   Relevant Medications   atorvastatin (LIPITOR) 80 MG tablet   lisinopril  (ZESTRIL ) 10 MG tablet   Vitamin D deficiency    Return in about 4 weeks (around 11/18/2023).   Total time spent: 25 minutes  Google, NP  10/21/2023   This document may have been prepared by Dragon Voice Recognition software and as such may include unintentional dictation errors.

## 2023-10-28 ENCOUNTER — Other Ambulatory Visit: Payer: Self-pay

## 2023-10-28 MED ORDER — DEXCOM G7 SENSOR MISC: 11 refills | Status: DC

## 2023-11-18 ENCOUNTER — Ambulatory Visit: Admitting: Cardiology

## 2023-11-20 ENCOUNTER — Encounter: Payer: Self-pay | Admitting: Cardiology

## 2023-11-20 ENCOUNTER — Ambulatory Visit: Admitting: Cardiology

## 2023-11-20 VITALS — BP 126/74 | HR 89 | Ht 63.0 in | Wt 266.4 lb

## 2023-11-20 DIAGNOSIS — E1165 Type 2 diabetes mellitus with hyperglycemia: Secondary | ICD-10-CM | POA: Insufficient documentation

## 2023-11-20 DIAGNOSIS — E782 Mixed hyperlipidemia: Secondary | ICD-10-CM

## 2023-11-20 DIAGNOSIS — I152 Hypertension secondary to endocrine disorders: Secondary | ICD-10-CM

## 2023-11-20 DIAGNOSIS — E039 Hypothyroidism, unspecified: Secondary | ICD-10-CM | POA: Diagnosis not present

## 2023-11-20 DIAGNOSIS — R5383 Other fatigue: Secondary | ICD-10-CM

## 2023-11-20 DIAGNOSIS — E1169 Type 2 diabetes mellitus with other specified complication: Secondary | ICD-10-CM | POA: Diagnosis not present

## 2023-11-20 DIAGNOSIS — E1159 Type 2 diabetes mellitus with other circulatory complications: Secondary | ICD-10-CM | POA: Insufficient documentation

## 2023-11-20 DIAGNOSIS — R202 Paresthesia of skin: Secondary | ICD-10-CM

## 2023-11-20 DIAGNOSIS — R200 Anesthesia of skin: Secondary | ICD-10-CM | POA: Insufficient documentation

## 2023-11-20 DIAGNOSIS — E559 Vitamin D deficiency, unspecified: Secondary | ICD-10-CM

## 2023-11-20 MED ORDER — MOUNJARO 5 MG/0.5ML ~~LOC~~ SOAJ: 5.0000 mg | SUBCUTANEOUS | 3 refills | Status: DC

## 2023-11-20 NOTE — Progress Notes (Unsigned)
 Established Patient Office Visit  Subjective:  Patient ID: Sara Buchanan, female    DOB: [birth year]  Age: 63 y.o. MRN: [id number]  Chief Complaint  Patient presents with   Follow-up    4 week follow up      Patient in office for 4 week follow up. Patient doing well overall. Complains of fatigue, left arm tingling, lower extremity edema. Patient requesting a referral to cardiology. Patient has many risks factors for CAD including diabetes, obesity, HTN and HLD. Referral sent.  Patient taking and tolerating Mounjaro, will increase dose to 5 mg weekly.  Continue all other medications.     No other concerns at this time.   Past Medical History:  Diagnosis Date   Diabetes (HCC)    High cholesterol    Hypothyroidism    Thyroid condition     Past Surgical History:  Procedure Laterality Date   COLONOSCOPY WITH PROPOFOL  N/A 01/05/2021   Procedure: COLONOSCOPY WITH PROPOFOL ;  Surgeon: Jinny Carmine, MD;  Location: [facility];  Service: Endoscopy;  Laterality: N/A;  Diabetic   FOOT SURGERY Bilateral     Social History   Socioeconomic History   Marital status: Married    Spouse name: Not on file   Number of children: Not on file   Years of education: Not on file   Highest education level: Not on file  Occupational History   Not on file  Tobacco Use   Smoking status: Never   Smokeless tobacco: Never  Vaping Use   Vaping status: Never Used  Substance and Sexual Activity   Alcohol use: No    Comment: rare   Drug use: Never   Sexual activity: Not Currently    Birth control/protection: None, Post-menopausal  Other Topics Concern   Not on file  Social History Narrative   Not on file   Social Drivers of Health   Financial Resource Strain: Not on file  Food Insecurity: Not on file  Transportation Needs: Not on file  Physical Activity: Not on file  Stress: Not on file  Social Connections: Not on file  Intimate Partner Violence: Not on file    Family  History  Problem Relation Age of Onset   Diabetes Mother    Hypertension Mother    Hypertension Father    Cancer Father    Breast cancer Brother     Allergies  Allergen Reactions   Metformin And Related Diarrhea    Outpatient Medications Prior to Visit  Medication Sig   atorvastatin (LIPITOR) 80 MG tablet Take 1 tablet (80 mg total) by mouth daily.   Cholecalciferol (VITAMIN D3) 1.25 MG (50000 UT) CAPS Take 1 capsule (1.25 mg total) by mouth once a week.   Continuous Glucose Receiver (DEXCOM G7 RECEIVER) DEVI 1   Continuous Glucose Sensor (DEXCOM G7 SENSOR) MISC USE WITH DEXCOM RECEIVER FOR MONITORING BLOOD SUGAR. CHANGE SENSOR EVERY 10 DAYS.   dapagliflozin  propanediol (FARXIGA ) 10 MG TABS tablet Take 1 tablet (10 mg total) by mouth daily.   insulin degludec (TRESIBA FLEXTOUCH) 100 UNIT/ML FlexTouch Pen Inject 50 Units into the skin daily.   lisinopril  (ZESTRIL ) 10 MG tablet Take 1 tablet (10 mg total) by mouth daily.   NOVOLOG FLEXPEN 100 UNIT/ML FlexPen Inject 12 Units into the skin 3 (three) times daily with meals. Sliding scale with meals, maximum 16 units   ONETOUCH VERIO test strip 1 each by Other route 2 (two) times daily.   [DISCONTINUED] tirzepatide (MOUNJARO) 2.5 MG/0.5ML Pen Inject  2.5 mg into the skin once a week.   terbinafine  (LAMISIL ) 250 MG tablet Take 1 tablet (250 mg total) by mouth daily. (Patient not taking: Reported on 11/20/2023)   No facility-administered medications prior to visit.    Review of Systems  Constitutional: Negative.   HENT: Negative.    Eyes: Negative.   Respiratory: Negative.  Negative for shortness of breath.   Cardiovascular: Negative.  Negative for chest pain.  Gastrointestinal: Negative.  Negative for abdominal pain, constipation and diarrhea.  Genitourinary: Negative.   Musculoskeletal:  Negative for joint pain and myalgias.  Skin: Negative.   Neurological: Negative.  Negative for dizziness and headaches.  Endo/Heme/Allergies:  Negative.   All other systems reviewed and are negative.      Objective:   BP 126/74 (BP Location: Right Arm, Patient Position: Sitting, Cuff Size: Large)   Pulse 89   Ht 5' 3 (1.6 m)   Wt 266 lb 6.4 oz (120.8 kg)   SpO2 97%   BMI 47.19 kg/m   Vitals:   11/20/23 1505 11/20/23 1520  BP: (!) 142/74 126/74  Pulse: 89   Height: 5' 3 (1.6 m)   Weight: 266 lb 6.4 oz (120.8 kg)   SpO2: 97%   BMI (Calculated): 47.2     Physical Exam Vitals and nursing note reviewed.  Constitutional:      Appearance: Normal appearance. She is normal weight.  HENT:     Head: Normocephalic and atraumatic.     Nose: Nose normal.     Mouth/Throat:     Mouth: Mucous membranes are moist.  Eyes:     Extraocular Movements: Extraocular movements intact.     Conjunctiva/sclera: Conjunctivae normal.     Pupils: Pupils are equal, round, and reactive to light.  Cardiovascular:     Rate and Rhythm: Normal rate and regular rhythm.     Pulses: Normal pulses.     Heart sounds: Normal heart sounds.  Pulmonary:     Effort: Pulmonary effort is normal.     Breath sounds: Normal breath sounds.  Abdominal:     General: Abdomen is flat. Bowel sounds are normal.     Palpations: Abdomen is soft.  Musculoskeletal:        General: Normal range of motion.     Cervical back: Normal range of motion.  Skin:    General: Skin is warm and dry.  Neurological:     General: No focal deficit present.     Mental Status: She is alert and oriented to person, place, and time.  Psychiatric:        Mood and Affect: Mood normal.        Behavior: Behavior normal.        Thought Content: Thought content normal.        Judgment: Judgment normal.      No results found for any visits on 11/20/23.  Recent Results (from the past 2160 hours)  Vitamin D (25 hydroxy)     Status: Abnormal   Collection Time: 10/15/23  8:48 AM  Result Value Ref Range   Vit D, 25-Hydroxy 23.5 (L) 30.0 - 100.0 ng/mL    Comment: Vitamin D  deficiency has been defined by the Institute of Medicine and an Endocrine Society practice guideline as a level of serum 25-OH vitamin D less than 20 ng/mL (1,2). The Endocrine Society went on to further define vitamin D insufficiency as a level between 21 and 29 ng/mL (2). 1. IOM (Institute of Medicine). 2010.  Dietary reference    intakes for calcium and D. [place]: The    [employer]. 2. Holick MF, Binkley [place], Bischoff-Ferrari HA, et al.    Evaluation, treatment, and prevention of vitamin D    deficiency: an Endocrine Society clinical practice    guideline. JCEM. 2011 Jul; 96(7):1911-30.   Hemoglobin A1c     Status: Abnormal   Collection Time: 10/15/23  8:48 AM  Result Value Ref Range   Hgb A1c MFr Bld 9.6 (H) 4.8 - 5.6 %    Comment:          Prediabetes: 5.7 - 6.4          Diabetes: >6.4          Glycemic control for adults with diabetes: <7.0    Est. average glucose Bld gHb Est-mCnc 229 mg/dL  TSH     Status: None   Collection Time: 10/15/23  8:48 AM  Result Value Ref Range   TSH 1.080 0.450 - 4.500 uIU/mL  Lipid Profile     Status: Abnormal   Collection Time: 10/15/23  8:48 AM  Result Value Ref Range   Cholesterol, Total 206 (H) 100 - 199 mg/dL   Triglycerides 84 0 - 149 mg/dL   HDL 57 >60 mg/dL   VLDL Cholesterol Cal 15 5 - 40 mg/dL   LDL Chol Calc (NIH) 865 (H) 0 - 99 mg/dL   Chol/HDL Ratio 3.6 0.0 - 4.4 ratio    Comment:                                   T. Chol/HDL Ratio                                             Men  Women                               1/2 Avg.Risk  3.4    3.3                                   Avg.Risk  5.0    4.4                                2X Avg.Risk  9.6    7.1                                3X Avg.Risk 23.4   11.0   CMP14+EGFR     Status: Abnormal   Collection Time: 10/15/23  8:48 AM  Result Value Ref Range   Glucose 149 (H) 70 - 99 mg/dL   BUN 14 8 - 27 mg/dL   Creatinine, Ser 9.22 0.57 - 1.00 mg/dL   eGFR 87 >40  fO/fpw/8.26   BUN/Creatinine Ratio 18 12 - 28   Sodium 138 134 - 144 mmol/L   Potassium 4.5 3.5 - 5.2 mmol/L   Chloride 98 96 - 106 mmol/L   CO2 24 20 - 29 mmol/L   Calcium 9.4 8.7 - 10.3 mg/dL   Total  Protein 6.7 6.0 - 8.5 g/dL   Albumin 4.0 3.9 - 4.9 g/dL   Globulin, Total 2.7 1.5 - 4.5 g/dL   Bilirubin Total 0.7 0.0 - 1.2 mg/dL   Alkaline Phosphatase 111 49 - 135 IU/L   AST 17 0 - 40 IU/L   ALT 14 0 - 32 IU/L      Assessment & Plan:  Referral sent to cardiology Increase Mounjaro to 0.5 mg weekly Continue all other medications  Problem List Items Addressed This Visit       Cardiovascular and Mediastinum   Hypertension associated with diabetes (HCC)   Relevant Medications   tirzepatide (MOUNJARO) 5 MG/0.5ML Pen   Other Relevant Orders   Ambulatory referral to Cardiology     Endocrine   Hypothyroidism   Type 2 diabetes mellitus with hyperglycemia, without long-term current use of insulin (HCC) - Primary   Relevant Medications   tirzepatide (MOUNJARO) 5 MG/0.5ML Pen   Other Relevant Orders   Ambulatory referral to Cardiology   Combined hyperlipidemia associated with type 2 diabetes mellitus (HCC)   Relevant Medications   tirzepatide (MOUNJARO) 5 MG/0.5ML Pen   Other Relevant Orders   Ambulatory referral to Cardiology     Other   Vitamin D deficiency   Numbness and tingling in left arm   Relevant Orders   Ambulatory referral to Cardiology   Other fatigue   Relevant Orders   Ambulatory referral to Cardiology    Return in about 3 months (around 02/20/2024) for fasting lab work prior.   Total time spent: 25 minutes. This time includes review of previous notes and results and patient face to face interaction during today's visit.    Jeoffrey Pollen, NP  11/20/2023   This document may have been prepared by Dragon Voice Recognition software and as such may include unintentional dictation errors.

## 2023-11-21 DIAGNOSIS — R5383 Other fatigue: Secondary | ICD-10-CM | POA: Insufficient documentation

## 2023-11-28 ENCOUNTER — Telehealth: Payer: Self-pay

## 2023-11-28 ENCOUNTER — Other Ambulatory Visit: Payer: Self-pay | Admitting: Cardiology

## 2023-11-28 MED ORDER — DEXCOM G7 RECEIVER DEVI: 1 refills | Status: AC

## 2023-11-28 MED ORDER — MOUNJARO 7.5 MG/0.5ML ~~LOC~~ SOAJ: 7.5000 mg | SUBCUTANEOUS | 4 refills | Status: AC

## 2023-11-28 MED ORDER — DEXCOM G7 SENSOR MISC: 1 refills | Status: AC

## 2023-11-28 NOTE — Telephone Encounter (Signed)
 Patient is requesting rx for 90 days for her dexcom and would like the next dose of the mounjaro  called in it should be the 7.5mg 

## 2023-12-09 ENCOUNTER — Other Ambulatory Visit: Payer: Self-pay

## 2023-12-11 ENCOUNTER — Other Ambulatory Visit: Payer: Self-pay

## 2023-12-11 MED ORDER — PEN NEEDLES 31G X 8 MM MISC: 0 refills | Status: AC

## 2023-12-17 ENCOUNTER — Ambulatory Visit: Payer: Self-pay | Attending: Internal Medicine | Admitting: Internal Medicine

## 2023-12-17 ENCOUNTER — Encounter: Payer: Self-pay | Admitting: Internal Medicine

## 2023-12-17 VITALS — BP 130/78 | HR 97 | Ht 64.0 in | Wt 265.8 lb

## 2023-12-17 DIAGNOSIS — E782 Mixed hyperlipidemia: Secondary | ICD-10-CM | POA: Diagnosis not present

## 2023-12-17 DIAGNOSIS — R072 Precordial pain: Secondary | ICD-10-CM | POA: Diagnosis not present

## 2023-12-17 DIAGNOSIS — E1169 Type 2 diabetes mellitus with other specified complication: Secondary | ICD-10-CM

## 2023-12-17 DIAGNOSIS — I152 Hypertension secondary to endocrine disorders: Secondary | ICD-10-CM

## 2023-12-17 DIAGNOSIS — R200 Anesthesia of skin: Secondary | ICD-10-CM

## 2023-12-17 DIAGNOSIS — E039 Hypothyroidism, unspecified: Secondary | ICD-10-CM

## 2023-12-17 DIAGNOSIS — R600 Localized edema: Secondary | ICD-10-CM

## 2023-12-17 DIAGNOSIS — R0600 Dyspnea, unspecified: Secondary | ICD-10-CM

## 2023-12-17 DIAGNOSIS — R0609 Other forms of dyspnea: Secondary | ICD-10-CM | POA: Diagnosis not present

## 2023-12-17 DIAGNOSIS — E1165 Type 2 diabetes mellitus with hyperglycemia: Secondary | ICD-10-CM

## 2023-12-17 DIAGNOSIS — R0789 Other chest pain: Secondary | ICD-10-CM

## 2023-12-17 DIAGNOSIS — E1159 Type 2 diabetes mellitus with other circulatory complications: Secondary | ICD-10-CM | POA: Diagnosis not present

## 2023-12-17 NOTE — Progress Notes (Unsigned)
°  Cardiology Office Note:  .   Date:  12/17/2023  ID:  Sara Buchanan, DOB 04/22/1960, MRN [id number] PCP: Carin Gauze, NP  [facility] Providers Cardiologist:  None { Click to update primary MD,subspecialty MD or APP then REFRESH:1}    History of Present Illness: .   Sara Buchanan is a 63 y.o. female with history of hypertension, hyperlipidemia, type 2 diabetes mellitus, and obesity, who has been referred for evaluation of fatigue, left arm tingling, and lower extremity edema by Ms. Scoggins.  Intermittent left arm tingling.  Intermittent lower extremity edema.  Present last 6 months.  Actually getting a little better.  Less pain down the left side.  Sometimes wakes up with left hand numbness.  Started on Monjaro, numbers getting a little better.  Sometimes little chest discomfort, usually when lies down.  Feels like wants to throw up or little nausea and little pressure.  Some DOE going up steps.  Occ palpitations, not much.  Usually if a lot on her mind.  No associated symptoms.  2-3 years ago thought he was having MI.  Went to ED.  Did several EKG's.  Fatigue in AM.  Reports normal sleep studies in the past.  ROS: See HPI  Studies Reviewed: SABRA   EKG Interpretation Date/Time:  Wednesday December 17 2023 15:16:28 EST Ventricular Rate:  97 PR Interval:  120 QRS Duration:  62 QT Interval:  352 QTC Calculation: 447 R Axis:   32  Text Interpretation: Normal sinus rhythm Normal ECG When compared with ECG of 10-Jan-2019 00:50, HEART RATE has increased Otherwise no significant change Confirmed by Elanora Quin, Lonni [phone]) on 12/17/2023 3:24:38 PM    Exercise MPI (08/09/2022): Reduced exercise capacity (patient exercised for 3 minutes achieving 4.6 METS).  No definite evidence of ischemia.  Normal LVEF.  Risk Assessment/Calculations:   {Does this patient have ATRIAL FIBRILLATION?:[id number]}         Physical Exam:   VS:  BP 130/78 (BP Location: Left Arm, Patient  Position: Sitting, Cuff Size: Normal)   Pulse 97   Ht 5' 4 (1.626 m)   Wt 265 lb 12.8 oz (120.6 kg)   SpO2 97%   BMI 45.62 kg/m    Wt Readings from Last 3 Encounters:  12/17/23 265 lb 12.8 oz (120.6 kg)  11/20/23 266 lb 6.4 oz (120.8 kg)  10/21/23 260 lb 3.2 oz (118 kg)    General:  NAD. Neck: No JVD or HJR. Lungs: Clear to auscultation bilaterally without wheezes or crackles. Heart: Regular rate and rhythm without murmurs, rubs, or gallops. Abdomen: Soft, nontender, nondistended. Extremities: No lower extremity edema.  ASSESSMENT AND PLAN: .    ***    {Are you ordering a CV Procedure (e.g. stress test, cath, DCCV, TEE, etc)?   Press F2        :[id number]}  Dispo: ***  Signed, Lonni Hanson, MD

## 2023-12-17 NOTE — Patient Instructions (Signed)
 Medication Instructions:  Your physician recommends that you continue on your current medications as directed. Please refer to the Current Medication list given to you today.   *If you need a refill on your cardiac medications before your next appointment, please call your pharmacy*  Lab Work: No test ordered today  If you have labs (blood work) drawn today and your tests are completely normal, you will receive your results only by: MyChart Message (if you have MyChart) OR A paper copy in the mail If you have any lab test that is abnormal or we need to change your treatment, we will call you to review the results.  Testing/Procedures: Your physician has requested that you have an echocardiogram. Echocardiography is a painless test that uses sound waves to create images of your heart. It provides your doctor with information about the size and shape of your heart and how well your hearts chambers and valves are working.   You may receive an ultrasound enhancing agent through an IV if needed to better visualize your heart during the echo. This procedure takes approximately one hour.  There are no restrictions for this procedure.  This will take place at [address] [facility] [address] [facility]) [address], [place] [address]  Please note: We ask at that you not bring children with you during ultrasound (echo/ vascular) testing. Due to room size and safety concerns, children are not allowed in the ultrasound rooms during exams. Our front office staff cannot provide observation of children in our lobby area while testing is being conducted. An adult accompanying a patient to their appointment will only be allowed in the ultrasound room at the discretion of the ultrasound technician under special circumstances. We apologize for any inconvenience.      Please report to Radiology at the [facility] Main Entrance 30 minutes early for your test.  [address] [place], [place] [address]                          OR   Please report to Radiology at [facility] Main Entrance, medical mall, 30 mins prior to your test.  [address]  [place], [place]  How to Prepare for Your Cardiac PET/CT Stress Test:  Nothing to eat or drink, except water , 3 hours prior to arrival time.  NO caffeine/decaffeinated products, or chocolate 12 hours prior to arrival. (Please note decaffeinated beverages (teas/coffees) still contain caffeine).  If you have caffeine within 12 hours prior, the test will need to be rescheduled.  Medication instructions: Do not take erectile dysfunction medications for 72 hours prior to test (sildenafil, tadalafil) Do not take nitrates (isosorbide mononitrate, Ranexa) the day before or day of test Do not take tamsulosin the day before or morning of test Hold theophylline containing medications for 12 hours. Hold Dipyridamole 48 hours prior to the test.  Diabetic Preparation: If able to eat breakfast prior to 3 hour fasting, you may take all medications, including your insulin. Do not worry if you miss your breakfast dose of insulin - start at your next meal. If you do not eat prior to 3 hour fast-Hold all diabetes (oral and insulin) medications. Patients who wear a continuous glucose monitor MUST remove the device prior to scanning.  You may take your remaining medications with water .  NO perfume, cologne or lotion on chest or abdomen area. FEMALES - Please avoid wearing dresses to this appointment.  Total time is  1 to 2 hours; you may want to bring reading material for the waiting time.  IF YOU THINK YOU MAY BE PREGNANT, OR ARE NURSING PLEASE INFORM THE TECHNOLOGIST.  In preparation for your appointment, medication and supplies will be purchased.  Appointment availability is limited, so if you need to cancel or reschedule, please call the Radiology Department Scheduler at [phone] 24 hours in advance to avoid a cancellation fee of  $100.00  What to Expect When you Arrive:  Once you arrive and check in for your appointment, you will be taken to a preparation room within the Radiology Department.  A technologist or Nurse will obtain your medical history, verify that you are correctly prepped for the exam, and explain the procedure.  Afterwards, an IV will be started in your arm and electrodes will be placed on your skin for EKG monitoring during the stress portion of the exam. Then you will be escorted to the PET/CT scanner.  There, staff will get you positioned on the scanner and obtain a blood pressure and EKG.  During the exam, you will continue to be connected to the EKG and blood pressure machines.  A small, safe amount of a radioactive tracer will be injected in your IV to obtain a series of pictures of your heart along with an injection of a stress agent.    After your Exam:  It is recommended that you eat a meal and drink a caffeinated beverage to counter act any effects of the stress agent.  Drink plenty of fluids for the remainder of the day and urinate frequently for the first couple of hours after the exam.  Your doctor will inform you of your test results within 7-10 business days.  For more information and frequently asked questions, please visit our website: [email or web address]  For questions about your test or how to prepare for your test, please call: Cardiac Imaging Nurse Navigators Office: [phone]   Follow-Up: At [facility], you and your health needs are our priority.  As part of our continuing mission to provide you with exceptional heart care, our providers are all part of one team.  This team includes your primary Cardiologist (physician) and Advanced Practice Providers or APPs (Physician Assistants and Nurse Practitioners) who all work together to provide you with the care you need, when you need it.  Your next appointment:   3 month(s)  Provider:   You may see  Lonni Hanson, MD  or one of the following Advanced Practice Providers on your designated Care Team:   Lonni Meager, NP Lesley Maffucci, PA-C Bernardino Bring, PA-C Cadence Monument, PA-C Tylene Lunch, NP Barnie Hila, NP    We recommend signing up for the patient portal called MyChart.  Sign up information is provided on this After Visit Summary.  MyChart is used to connect with patients for Virtual Visits (Telemedicine).  Patients are able to view lab/test results, encounter notes, upcoming appointments, etc.  Non-urgent messages can be sent to your provider as well.   To learn more about what you can do with MyChart, go to [email or web address].

## 2023-12-19 ENCOUNTER — Encounter: Payer: Self-pay | Admitting: Internal Medicine

## 2023-12-19 DIAGNOSIS — R0609 Other forms of dyspnea: Secondary | ICD-10-CM | POA: Insufficient documentation

## 2023-12-19 DIAGNOSIS — R072 Precordial pain: Secondary | ICD-10-CM | POA: Insufficient documentation

## 2024-01-19 ENCOUNTER — Ambulatory Visit: Attending: Internal Medicine

## 2024-01-19 DIAGNOSIS — R072 Precordial pain: Secondary | ICD-10-CM

## 2024-01-19 DIAGNOSIS — R0609 Other forms of dyspnea: Secondary | ICD-10-CM | POA: Diagnosis not present

## 2024-01-19 LAB — ECHOCARDIOGRAM COMPLETE
AR max vel: 1.84 cm2
AV Area VTI: 2.07 cm2
AV Area mean vel: 1.93 cm2
AV Mean grad: 6 mmHg
AV Peak grad: 10.9 mmHg
Ao pk vel: 1.65 m/s
Area-P 1/2: 2.91 cm2
Calc EF: 58.9 %
S' Lateral: 2.9 cm
Single Plane A2C EF: 56.4 %
Single Plane A4C EF: 59 %

## 2024-01-21 ENCOUNTER — Ambulatory Visit: Payer: Self-pay | Admitting: Internal Medicine

## 2024-02-05 ENCOUNTER — Ambulatory Visit

## 2024-02-06 ENCOUNTER — Other Ambulatory Visit: Payer: Self-pay

## 2024-02-06 MED ORDER — GLIPIZIDE ER 2.5 MG PO TB24: 2.5000 mg | ORAL_TABLET | Freq: Every day | ORAL | 0 refills | Status: AC

## 2024-02-20 ENCOUNTER — Ambulatory Visit: Admitting: Cardiology

## 2024-03-19 ENCOUNTER — Ambulatory Visit: Admitting: Physician Assistant
